# Patient Record
Sex: Female | Born: 1970 | Race: Black or African American | Hispanic: No | Marital: Married | Smoking: Never smoker
Health system: Southern US, Community
[De-identification: ages and names within clinical notes are randomized; demographics above are authoritative.]

## PROBLEM LIST (undated history)

## (undated) DIAGNOSIS — R51 Headache: Secondary | ICD-10-CM

## (undated) DIAGNOSIS — D649 Anemia, unspecified: Secondary | ICD-10-CM

## (undated) DIAGNOSIS — B20 Human immunodeficiency virus [HIV] disease: Secondary | ICD-10-CM

## (undated) DIAGNOSIS — Z21 Asymptomatic human immunodeficiency virus [HIV] infection status: Secondary | ICD-10-CM

## (undated) HISTORY — DX: Morbid (severe) obesity due to excess calories: E66.01

## (undated) HISTORY — PX: LAPAROSCOPY ABDOMEN DIAGNOSTIC: PRO50

## (undated) HISTORY — PX: ABDOMINAL HYSTERECTOMY: SHX81

## (undated) HISTORY — PX: CERVICAL CONIZATION W/BX: SHX1330

---

## 1999-04-14 ENCOUNTER — Ambulatory Visit (HOSPITAL_COMMUNITY): Admission: RE | Admit: 1999-04-14 | Discharge: 1999-04-14 | Payer: Self-pay | Admitting: Internal Medicine

## 1999-04-14 ENCOUNTER — Encounter: Admission: RE | Admit: 1999-04-14 | Discharge: 1999-04-14 | Payer: Self-pay | Admitting: Internal Medicine

## 1999-05-10 ENCOUNTER — Encounter: Admission: RE | Admit: 1999-05-10 | Discharge: 1999-05-10 | Payer: Self-pay | Admitting: Internal Medicine

## 1999-06-29 ENCOUNTER — Encounter: Admission: RE | Admit: 1999-06-29 | Discharge: 1999-06-29 | Payer: Self-pay | Admitting: Internal Medicine

## 1999-08-03 ENCOUNTER — Encounter: Admission: RE | Admit: 1999-08-03 | Discharge: 1999-08-03 | Payer: Self-pay | Admitting: Internal Medicine

## 1999-08-03 ENCOUNTER — Ambulatory Visit (HOSPITAL_COMMUNITY): Admission: RE | Admit: 1999-08-03 | Discharge: 1999-08-03 | Payer: Self-pay | Admitting: Internal Medicine

## 1999-08-25 ENCOUNTER — Encounter: Admission: RE | Admit: 1999-08-25 | Discharge: 1999-08-25 | Payer: Self-pay | Admitting: Internal Medicine

## 1999-11-08 ENCOUNTER — Ambulatory Visit (HOSPITAL_COMMUNITY): Admission: RE | Admit: 1999-11-08 | Discharge: 1999-11-08 | Payer: Self-pay | Admitting: Internal Medicine

## 1999-11-08 ENCOUNTER — Encounter: Admission: RE | Admit: 1999-11-08 | Discharge: 1999-11-08 | Payer: Self-pay | Admitting: Internal Medicine

## 2000-01-10 ENCOUNTER — Encounter: Admission: RE | Admit: 2000-01-10 | Discharge: 2000-01-10 | Payer: Self-pay | Admitting: Internal Medicine

## 2000-04-04 ENCOUNTER — Ambulatory Visit (HOSPITAL_COMMUNITY): Admission: RE | Admit: 2000-04-04 | Discharge: 2000-04-04 | Payer: Self-pay | Admitting: Internal Medicine

## 2000-04-04 ENCOUNTER — Encounter: Admission: RE | Admit: 2000-04-04 | Discharge: 2000-04-04 | Payer: Self-pay | Admitting: Internal Medicine

## 2000-04-26 ENCOUNTER — Encounter: Admission: RE | Admit: 2000-04-26 | Discharge: 2000-04-26 | Payer: Self-pay | Admitting: Internal Medicine

## 2000-09-05 ENCOUNTER — Ambulatory Visit (HOSPITAL_COMMUNITY): Admission: RE | Admit: 2000-09-05 | Discharge: 2000-09-05 | Payer: Self-pay | Admitting: Internal Medicine

## 2000-09-05 ENCOUNTER — Encounter: Admission: RE | Admit: 2000-09-05 | Discharge: 2000-09-05 | Payer: Self-pay | Admitting: Internal Medicine

## 2001-03-30 ENCOUNTER — Encounter: Admission: RE | Admit: 2001-03-30 | Discharge: 2001-03-30 | Payer: Self-pay | Admitting: Internal Medicine

## 2001-03-30 ENCOUNTER — Ambulatory Visit (HOSPITAL_COMMUNITY): Admission: RE | Admit: 2001-03-30 | Discharge: 2001-03-30 | Payer: Self-pay | Admitting: Internal Medicine

## 2003-07-08 ENCOUNTER — Other Ambulatory Visit: Admission: RE | Admit: 2003-07-08 | Discharge: 2003-07-08 | Payer: Self-pay | Admitting: Family Medicine

## 2006-10-22 ENCOUNTER — Emergency Department (HOSPITAL_COMMUNITY): Admission: EM | Admit: 2006-10-22 | Discharge: 2006-10-22 | Payer: Self-pay | Admitting: Emergency Medicine

## 2007-04-19 ENCOUNTER — Encounter: Admission: RE | Admit: 2007-04-19 | Discharge: 2007-04-19 | Payer: Self-pay | Admitting: *Deleted

## 2007-08-22 ENCOUNTER — Emergency Department (HOSPITAL_COMMUNITY): Admission: EM | Admit: 2007-08-22 | Discharge: 2007-08-22 | Payer: Self-pay | Admitting: Family Medicine

## 2008-01-22 ENCOUNTER — Ambulatory Visit: Payer: Self-pay | Admitting: Internal Medicine

## 2008-01-22 ENCOUNTER — Encounter: Admission: RE | Admit: 2008-01-22 | Discharge: 2008-01-22 | Payer: Self-pay | Admitting: Internal Medicine

## 2008-01-22 DIAGNOSIS — B20 Human immunodeficiency virus [HIV] disease: Secondary | ICD-10-CM | POA: Insufficient documentation

## 2008-01-22 LAB — CONVERTED CEMR LAB
ALT: 21 units/L (ref 0–35)
CO2: 25 meq/L (ref 19–32)
Chloride: 103 meq/L (ref 96–112)
Eosinophils Absolute: 0 10*3/uL (ref 0.0–0.7)
Eosinophils Relative: 0 % (ref 0–5)
HCT: 33.4 % — ABNORMAL LOW (ref 36.0–46.0)
HIV 1 RNA Quant: 31800 copies/mL — ABNORMAL HIGH (ref <50)
Hemoglobin: 11.2 g/dL — ABNORMAL LOW (ref 12.0–15.0)
MCV: 74.7 fL — ABNORMAL LOW (ref 78.0–100.0)
Monocytes Absolute: 0.4 10*3/uL (ref 0.1–1.0)
Neutro Abs: 1.3 10*3/uL — ABNORMAL LOW (ref 1.7–7.7)
Potassium: 3.8 meq/L (ref 3.5–5.3)
RBC: 4.47 M/uL (ref 3.87–5.11)
RDW: 13.5 % (ref 11.5–15.5)
Sodium: 138 meq/L (ref 135–145)
Total Bilirubin: 0.5 mg/dL (ref 0.3–1.2)
Total Protein: 9 g/dL — ABNORMAL HIGH (ref 6.0–8.3)

## 2008-02-05 ENCOUNTER — Ambulatory Visit: Payer: Self-pay | Admitting: Internal Medicine

## 2008-02-05 ENCOUNTER — Encounter (INDEPENDENT_AMBULATORY_CARE_PROVIDER_SITE_OTHER): Payer: Self-pay | Admitting: *Deleted

## 2008-02-05 ENCOUNTER — Other Ambulatory Visit: Admission: RE | Admit: 2008-02-05 | Discharge: 2008-02-05 | Payer: Self-pay | Admitting: Family Medicine

## 2008-02-05 DIAGNOSIS — B977 Papillomavirus as the cause of diseases classified elsewhere: Secondary | ICD-10-CM | POA: Insufficient documentation

## 2008-02-05 DIAGNOSIS — R87612 Low grade squamous intraepithelial lesion on cytologic smear of cervix (LGSIL): Secondary | ICD-10-CM | POA: Insufficient documentation

## 2008-02-05 DIAGNOSIS — F32A Depression, unspecified: Secondary | ICD-10-CM | POA: Insufficient documentation

## 2008-02-05 DIAGNOSIS — O00109 Unspecified tubal pregnancy without intrauterine pregnancy: Secondary | ICD-10-CM | POA: Insufficient documentation

## 2008-02-05 DIAGNOSIS — F329 Major depressive disorder, single episode, unspecified: Secondary | ICD-10-CM

## 2008-02-05 LAB — CONVERTED CEMR LAB: Pap Smear: ABNORMAL

## 2008-02-06 ENCOUNTER — Encounter: Payer: Self-pay | Admitting: Internal Medicine

## 2008-02-13 ENCOUNTER — Telehealth (INDEPENDENT_AMBULATORY_CARE_PROVIDER_SITE_OTHER): Payer: Self-pay | Admitting: *Deleted

## 2008-02-20 ENCOUNTER — Encounter (INDEPENDENT_AMBULATORY_CARE_PROVIDER_SITE_OTHER): Payer: Self-pay | Admitting: *Deleted

## 2008-03-20 ENCOUNTER — Encounter: Admission: RE | Admit: 2008-03-20 | Discharge: 2008-03-20 | Payer: Self-pay | Admitting: Internal Medicine

## 2008-03-20 ENCOUNTER — Ambulatory Visit: Payer: Self-pay | Admitting: Internal Medicine

## 2008-03-20 LAB — CONVERTED CEMR LAB
Albumin: 4 g/dL (ref 3.5–5.2)
BUN: 15 mg/dL (ref 6–23)
Basophils Relative: 1 % (ref 0–1)
Calcium: 9 mg/dL (ref 8.4–10.5)
Cholesterol: 206 mg/dL — ABNORMAL HIGH (ref 0–200)
Creatinine, Ser: 0.9 mg/dL (ref 0.40–1.20)
Eosinophils Absolute: 0.1 10*3/uL (ref 0.0–0.7)
Eosinophils Relative: 2 % (ref 0–5)
HCT: 31.6 % — ABNORMAL LOW (ref 36.0–46.0)
HIV 1 RNA Quant: 236 copies/mL — ABNORMAL HIGH (ref <50)
Hemoglobin: 11.1 g/dL — ABNORMAL LOW (ref 12.0–15.0)
LDL Cholesterol: 136 mg/dL — ABNORMAL HIGH (ref 0–99)
Lymphs Abs: 1.4 10*3/uL (ref 0.7–4.0)
MCHC: 35.1 g/dL (ref 30.0–36.0)
Monocytes Relative: 11 % (ref 3–12)
Neutrophils Relative %: 51 % (ref 43–77)
RBC: 4.29 M/uL (ref 3.87–5.11)

## 2008-06-11 ENCOUNTER — Ambulatory Visit (HOSPITAL_COMMUNITY): Admission: RE | Admit: 2008-06-11 | Discharge: 2008-06-11 | Payer: Self-pay | Admitting: Obstetrics and Gynecology

## 2008-06-11 ENCOUNTER — Encounter (INDEPENDENT_AMBULATORY_CARE_PROVIDER_SITE_OTHER): Payer: Self-pay | Admitting: Obstetrics and Gynecology

## 2008-06-30 ENCOUNTER — Ambulatory Visit: Payer: Self-pay | Admitting: Internal Medicine

## 2008-06-30 LAB — CONVERTED CEMR LAB
AST: 15 units/L (ref 0–37)
Albumin: 4 g/dL (ref 3.5–5.2)
Alkaline Phosphatase: 94 units/L (ref 39–117)
Basophils Absolute: 0 10*3/uL (ref 0.0–0.1)
Basophils Relative: 1 % (ref 0–1)
CO2: 22 meq/L (ref 19–32)
Calcium: 9 mg/dL (ref 8.4–10.5)
Chloride: 107 meq/L (ref 96–112)
Creatinine, Ser: 1.02 mg/dL (ref 0.40–1.20)
Eosinophils Absolute: 0.1 10*3/uL (ref 0.0–0.7)
Eosinophils Relative: 1 % (ref 0–5)
Glucose, Bld: 99 mg/dL (ref 70–99)
HCT: 33.4 % — ABNORMAL LOW (ref 36.0–46.0)
HIV 1 RNA Quant: 106 copies/mL — ABNORMAL HIGH (ref <48)
Neutro Abs: 4.3 10*3/uL (ref 1.7–7.7)
Neutrophils Relative %: 66 % (ref 43–77)
Potassium: 3.8 meq/L (ref 3.5–5.3)
RDW: 13.6 % (ref 11.5–15.5)
Total Bilirubin: 0.3 mg/dL (ref 0.3–1.2)
Total Protein: 8.1 g/dL (ref 6.0–8.3)

## 2009-01-01 ENCOUNTER — Ambulatory Visit: Payer: Self-pay | Admitting: Internal Medicine

## 2009-01-01 LAB — CONVERTED CEMR LAB
ALT: 22 units/L (ref 0–35)
AST: 20 units/L (ref 0–37)
Alkaline Phosphatase: 115 units/L (ref 39–117)
Basophils Absolute: 0 10*3/uL (ref 0.0–0.1)
Basophils Relative: 1 % (ref 0–1)
Bilirubin Urine: NEGATIVE
CO2: 24 meq/L (ref 19–32)
Creatinine, Ser: 0.85 mg/dL (ref 0.40–1.20)
GFR calc Af Amer: >60 mL/min (ref >60)
Glucose, Bld: 101 mg/dL — ABNORMAL HIGH (ref 70–99)
HIV-1 RNA Quant, Log: 1.98 — ABNORMAL HIGH (ref <1.68)
Hemoglobin, Urine: NEGATIVE
Hemoglobin: 11.7 g/dL — ABNORMAL LOW (ref 12.0–15.0)
MCV: 78.2 fL (ref 78.0–100.0)
Monocytes Relative: 11 % (ref 3–12)
Neutrophils Relative %: 63 % (ref 43–77)
Nitrite: NEGATIVE
Potassium: 4.1 meq/L (ref 3.5–5.3)
Specific Gravity, Urine: 1.026 (ref 1.005–1.030)
Urine Glucose: NEGATIVE mg/dL
Urobilinogen, UA: 0.2 (ref 0.0–1.0)
pH: 7 (ref 5.0–8.0)

## 2009-01-15 ENCOUNTER — Ambulatory Visit: Payer: Self-pay | Admitting: Internal Medicine

## 2009-01-16 ENCOUNTER — Encounter (INDEPENDENT_AMBULATORY_CARE_PROVIDER_SITE_OTHER): Payer: Self-pay | Admitting: Obstetrics and Gynecology

## 2009-01-16 ENCOUNTER — Other Ambulatory Visit: Admission: RE | Admit: 2009-01-16 | Discharge: 2009-01-16 | Payer: Self-pay | Admitting: Obstetrics and Gynecology

## 2009-01-19 ENCOUNTER — Encounter: Payer: Self-pay | Admitting: Internal Medicine

## 2009-02-02 ENCOUNTER — Telehealth: Payer: Self-pay | Admitting: Internal Medicine

## 2009-02-03 ENCOUNTER — Encounter: Payer: Self-pay | Admitting: Internal Medicine

## 2009-02-04 ENCOUNTER — Encounter (INDEPENDENT_AMBULATORY_CARE_PROVIDER_SITE_OTHER): Payer: Self-pay | Admitting: *Deleted

## 2009-02-11 ENCOUNTER — Encounter: Payer: Self-pay | Admitting: Internal Medicine

## 2009-02-12 ENCOUNTER — Encounter: Payer: Self-pay | Admitting: Internal Medicine

## 2009-02-12 ENCOUNTER — Encounter (INDEPENDENT_AMBULATORY_CARE_PROVIDER_SITE_OTHER): Payer: Self-pay | Admitting: *Deleted

## 2009-03-17 ENCOUNTER — Emergency Department (HOSPITAL_COMMUNITY): Admission: EM | Admit: 2009-03-17 | Discharge: 2009-03-17 | Payer: Self-pay | Admitting: Emergency Medicine

## 2009-06-09 ENCOUNTER — Encounter: Payer: Self-pay | Admitting: Internal Medicine

## 2009-06-11 ENCOUNTER — Telehealth: Payer: Self-pay | Admitting: Internal Medicine

## 2009-07-13 ENCOUNTER — Ambulatory Visit: Payer: Self-pay | Admitting: Internal Medicine

## 2009-07-13 LAB — CONVERTED CEMR LAB
AST: 16 units/L (ref 0–37)
Alkaline Phosphatase: 100 units/L (ref 39–117)
Basophils Absolute: 0 10*3/uL (ref 0.0–0.1)
Eosinophils Absolute: 0.1 10*3/uL (ref 0.0–0.7)
Glucose, Bld: 97 mg/dL (ref 70–99)
HCT: 35.9 % — ABNORMAL LOW (ref 36.0–46.0)
HDL: 61 mg/dL (ref >39)
HIV 1 RNA Quant: 93 copies/mL — ABNORMAL HIGH (ref <48)
Hemoglobin: 12.4 g/dL (ref 12.0–15.0)
LDL Cholesterol: 133 mg/dL — ABNORMAL HIGH (ref 0–99)
Lymphocytes Relative: 24 % (ref 12–46)
MCHC: 34.5 g/dL (ref 30.0–36.0)
MCV: 78.6 fL (ref >=78.0)
Monocytes Absolute: 0.4 10*3/uL (ref 0.1–1.0)
Monocytes Relative: 9 % (ref 3–12)
Neutrophils Relative %: 65 % (ref 43–77)
RBC: 4.57 M/uL (ref 3.87–5.11)
RDW: 13.8 % (ref 11.5–15.5)
Total Bilirubin: 0.3 mg/dL (ref 0.3–1.2)
Total CHOL/HDL Ratio: 3.3
Triglycerides: 40 mg/dL (ref <150)
VLDL: 8 mg/dL (ref 0–40)

## 2009-07-28 ENCOUNTER — Ambulatory Visit: Payer: Self-pay | Admitting: Internal Medicine

## 2009-08-19 ENCOUNTER — Other Ambulatory Visit: Admission: RE | Admit: 2009-08-19 | Discharge: 2009-08-19 | Payer: Self-pay | Admitting: Obstetrics and Gynecology

## 2009-09-21 ENCOUNTER — Encounter: Payer: Self-pay | Admitting: Internal Medicine

## 2009-11-23 ENCOUNTER — Telehealth (INDEPENDENT_AMBULATORY_CARE_PROVIDER_SITE_OTHER): Payer: Self-pay | Admitting: *Deleted

## 2009-11-25 ENCOUNTER — Encounter: Payer: Self-pay | Admitting: Internal Medicine

## 2009-11-25 ENCOUNTER — Ambulatory Visit: Payer: Self-pay | Admitting: Internal Medicine

## 2009-11-25 ENCOUNTER — Encounter (INDEPENDENT_AMBULATORY_CARE_PROVIDER_SITE_OTHER): Payer: Self-pay | Admitting: Licensed Clinical Social Worker

## 2009-11-25 LAB — CONVERTED CEMR LAB
CO2: 24 meq/L (ref 19–32)
Calcium: 9.2 mg/dL (ref 8.4–10.5)
Chloride: 105 meq/L (ref 96–112)
Potassium: 3.8 meq/L (ref 3.5–5.3)

## 2009-11-26 ENCOUNTER — Telehealth: Payer: Self-pay | Admitting: Internal Medicine

## 2009-12-31 ENCOUNTER — Telehealth: Payer: Self-pay | Admitting: Internal Medicine

## 2010-01-07 ENCOUNTER — Encounter: Payer: Self-pay | Admitting: Infectious Disease

## 2010-01-18 ENCOUNTER — Ambulatory Visit: Payer: Self-pay | Admitting: Internal Medicine

## 2010-01-18 LAB — CONVERTED CEMR LAB
ALT: 31 units/L (ref 0–35)
Alkaline Phosphatase: 112 units/L (ref 39–117)
BUN: 13 mg/dL (ref 6–23)
Calcium: 8.8 mg/dL (ref 8.4–10.5)
Creatinine, Ser: 0.81 mg/dL (ref 0.40–1.20)
HCT: 33.5 % — ABNORMAL LOW (ref 36.0–46.0)
HIV 1 RNA Quant: <48 copies/mL (ref <48)
HIV-1 RNA Quant, Log: <1.68 (ref <1.68)
LDL Cholesterol: 122 mg/dL — ABNORMAL HIGH (ref 0–99)
Lymphocytes Relative: 18 % (ref 12–46)
Monocytes Absolute: 0.4 10*3/uL (ref 0.1–1.0)
Neutro Abs: 4.5 10*3/uL (ref 1.7–7.7)
Potassium: 4.2 meq/L (ref 3.5–5.3)
RBC: 4.27 M/uL (ref 3.87–5.11)
Total Bilirubin: 0.4 mg/dL (ref 0.3–1.2)
Triglycerides: 64 mg/dL (ref <150)

## 2010-02-02 ENCOUNTER — Ambulatory Visit: Payer: Self-pay | Admitting: Internal Medicine

## 2010-02-02 DIAGNOSIS — J309 Allergic rhinitis, unspecified: Secondary | ICD-10-CM | POA: Insufficient documentation

## 2010-02-17 ENCOUNTER — Encounter (INDEPENDENT_AMBULATORY_CARE_PROVIDER_SITE_OTHER): Payer: Self-pay | Admitting: *Deleted

## 2010-02-17 ENCOUNTER — Encounter: Admission: RE | Admit: 2010-02-17 | Discharge: 2010-02-17 | Payer: Self-pay | Admitting: Family Medicine

## 2010-02-17 ENCOUNTER — Other Ambulatory Visit: Admission: RE | Admit: 2010-02-17 | Discharge: 2010-02-17 | Payer: Self-pay | Admitting: Obstetrics and Gynecology

## 2010-02-17 LAB — CONVERTED CEMR LAB: Pap Smear: NEGATIVE

## 2010-03-17 ENCOUNTER — Telehealth (INDEPENDENT_AMBULATORY_CARE_PROVIDER_SITE_OTHER): Payer: Self-pay | Admitting: *Deleted

## 2010-04-29 ENCOUNTER — Encounter (INDEPENDENT_AMBULATORY_CARE_PROVIDER_SITE_OTHER): Payer: Self-pay | Admitting: *Deleted

## 2010-06-08 ENCOUNTER — Ambulatory Visit: Payer: Self-pay | Admitting: Internal Medicine

## 2010-06-08 LAB — CONVERTED CEMR LAB
ALT: 21 units/L (ref 0–35)
Albumin: 4.4 g/dL (ref 3.5–5.2)
Alkaline Phosphatase: 79 units/L (ref 39–117)
Basophils Absolute: 0 10*3/uL (ref 0.0–0.1)
CO2: 22 meq/L (ref 19–32)
Calcium: 8.5 mg/dL (ref 8.4–10.5)
Creatinine, Ser: 0.83 mg/dL (ref 0.40–1.20)
Eosinophils Absolute: 0 10*3/uL (ref 0.0–0.7)
Eosinophils Relative: 1 % (ref 0–5)
Glucose, Bld: 95 mg/dL (ref 70–99)
HIV 1 RNA Quant: <20 copies/mL (ref <20)
HIV-1 RNA Quant, Log: <1.3 (ref <1.30)
LDL Cholesterol: 132 mg/dL — ABNORMAL HIGH (ref 0–99)
MCHC: 35.3 g/dL (ref 30.0–36.0)
MCV: 77.8 fL — ABNORMAL LOW (ref 78.0–100.0)
Neutro Abs: 4.3 10*3/uL (ref 1.7–7.7)
Neutrophils Relative %: 71 % (ref 43–77)
Sodium: 139 meq/L (ref 135–145)
Total Bilirubin: 0.3 mg/dL (ref 0.3–1.2)
Total Protein: 7.3 g/dL (ref 6.0–8.3)
Triglycerides: 94 mg/dL (ref <150)
WBC: 6.1 10*3/uL (ref 4.0–10.5)

## 2010-06-24 ENCOUNTER — Encounter: Payer: Self-pay | Admitting: Internal Medicine

## 2010-06-24 ENCOUNTER — Ambulatory Visit: Payer: Self-pay | Admitting: Internal Medicine

## 2010-06-24 DIAGNOSIS — F411 Generalized anxiety disorder: Secondary | ICD-10-CM | POA: Insufficient documentation

## 2010-06-25 ENCOUNTER — Encounter: Payer: Self-pay | Admitting: Internal Medicine

## 2010-07-06 ENCOUNTER — Encounter (INDEPENDENT_AMBULATORY_CARE_PROVIDER_SITE_OTHER): Payer: Self-pay | Admitting: *Deleted

## 2010-07-13 ENCOUNTER — Encounter: Payer: Self-pay | Admitting: Internal Medicine

## 2010-07-20 ENCOUNTER — Encounter: Payer: Self-pay | Admitting: Internal Medicine

## 2010-09-05 LAB — CONVERTED CEMR LAB
HCV Ab: NEGATIVE
Hepatitis B Surface Ag: NEGATIVE

## 2010-09-09 NOTE — Progress Notes (Signed)
Summary: c/o fatigue and poss. sinus inf., appt. 11/25/09 w/ Dr. 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 She also c/o headaches and congestion, thinks she may have a sinus infection. Wants to know if she needs to be seen? Initial call taken by: [name] CMA,  November 23, 2009 10:58 AM  Follow-up for Phone Call        Phone call to pt. Offered pt. acute appt. for 11/25/09 w/ Dr. Philipp Deputy for these problems.  Pt. to come @ 3:15 pm. [name] RN  November 23, 2009 11:56 AM

## 2010-09-09 NOTE — Miscellaneous (Signed)
Summary: RW Update  Clinical Lists Changes  Observations: Added new observation of RACE: African American (07/13/2010 14:02) Added new observation of PAYOR: Private (07/13/2010 14:02) Added new observation of GENDER: Female (07/13/2010 14:02)

## 2010-09-09 NOTE — Assessment & Plan Note (Signed)
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  Staying asleep is the problem, and Depression.  History of Present Illness: Alyssa Wilson is in for her routine visit.  She continues to be extremely stressed out about her work.  She says that she feels very anxious when she's at work and has trouble working a complete day.  She has been trying to take Friday afternoons off but still finds that she is extremely anxious when she knows she has to return to work.  She's had frequent episodes where she will awaken at night with a racing heart and panicked feeling.  She has also had increasing episodes where she will cry spontaneously and has been feeling depressed.  She has not had any thoughts of hurting herself.  Other than work she says that she is stressed because her 34 year old son is no longer living at home.  He is living with her grandmother but she says that "he is not doing everything he should." He was in counseling but has refused to go recently.  She started seeing a counselor two weeks ago.  She had a normal Pap smear recently. She says that her recent TB skin test was nonreactive. She denies missing a single dose of her Atripla.  Depression History:      The patient denies a depressed mood most of the day and a diminished interest in her usual daily activities.         Preventive Screening-Counseling & Management  Alcohol-Tobacco     Alcohol drinks/day: <1     Alcohol type: wine     Smoking Status: never     Passive Smoke Exposure: no  Caffeine-Diet-Exercise     Caffeine use/day: tea and sodas     Does Patient Exercise: yes     Type of exercise: walking     Exercise (avg: min/session): <30     Times/week: 7  Hep-HIV-STD-Contraception  HIV Risk: no risk noted     HIV Risk Counseling: not indicated-no HIV risk noted  Safety-Violence-Falls     Seat Belt Use: yes  Comments: declined condoms      Sexual History:  currently monogamous.        Drug Use:  never.     Updated Prior Medication List: ATRIPLA 600-200-300 MG TABS (EFAVIRENZ-EMTRICITAB-TENOFOVIR) Take 1 tablet by mouth once a day MULTIVITAMINS  TABS (MULTIPLE VITAMIN) Take 1 tablet by mouth once a day VITAMIN D (ERGOCALCIFEROL) 50000 UNIT CAPS (ERGOCALCIFEROL) Take one capsule per week for 12 weeks  Current Allergies (reviewed today): No known allergies  Social History: Sexual History:  currently monogamous  Vital Signs:  Patient profile:   40 year old female Menstrual status:  regular LMP:     06/08/2010 Height:      62.25 inches (158.12 cm) Weight:      163.0 pounds (74.09 kg) BMI:     29.68 Temp:     98.1 degrees F (36.72 degrees C) oral Pulse rate:   79 / minute BP sitting:   121 / 86  (left arm) Cuff size:   regular  Vitals Entered By: [name] RN (June 24, 2010 10:01 AM) CC: follow-up visit, upper left chest pain x 1 month, feels her heart beating,  she may even shake, especially at nigh and at her job as a Social research officer, 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  Staying asleep is the problem, Depression Is Patient Diabetic? No Pain Assessment Patient in pain? yes     Location: upper left chest, near shoulder Intensity: 3 Type: little stabs Onset of pain  things it might be heart palpitations, intermittent Nutritional Status BMI of 25 - 29 = overweight Nutritional Status Detail appetite "good"  Have you ever been in a relationship where you felt threatened, hurt or afraid?No   Does patient need assistance? Functional Status Self care Ambulation Normal Comments no missed doses or rxes LMP (date): 06/08/2010 LMP - Character: normal     Menstrual Status regular Enter  LMP: 06/08/2010 Last PAP Result normal        Medication Adherence: 06/24/2010   Adherence to medications reviewed with patient. Counseling to provide adequate adherence provided                                Physical Exam  General:  alert and well-nourished.   Mouth:  pharynx pink and moist.  no thrush good dentition.   Lungs:  normal breath sounds.  no crackles and no wheezes.   Heart:  normal rate, regular rhythm, and no murmur.   Skin:  no rashes.   Axillary Nodes:  no R axillary adenopathy and no L axillary adenopathy.   Psych:  normally interactive, good eye contact, not depressed appearing, tearful, and slightly anxious.     Impression & Recommendations:  Problem # 1:  HIV INFECTION (ICD-042) Her HIV infection is under excellent control.  I will continue her current regimen. Diagnostics Reviewed:  HIV: CDC-defined AIDS (02/05/2008)   CD4: 420 (06/09/2010)   WBC: 6.1 (06/08/2010)   Hgb: 12.4 (06/08/2010)   HCT: 35.1 (06/08/2010)   Platelets: 159 (06/08/2010) HIV-1 RNA: <20 copies/mL (06/08/2010)   HBSAg: NEG (02/05/2008)  Orders: Est. Patient Level IV (99214)Future Orders: T-CD4SP ([facility]) (CD4SP) ... 12/21/2010 T-HIV Viral Load [phone] [phone]) ... 12/21/2010 T-Comprehensive Metabolic Panel [id number]) ... 12/21/2010 T-CBC w/Diff ([id number]) ... 12/21/2010 T-RPR (Syphilis) [id number]) ... 12/21/2010 T-Lipid Profile [id number]) ... 12/21/2010  Problem # 2:  ANXIETY (ICD-300.00) She is struggling with worsening anxiety and now seems mildly depressed.  I have encouraged her to continue seeing her counselor on a regular basis and have asked her to call me if she is feeling worse between now and her next visit. Orders: Est. Patient Level IV (87564)  Medications Added to Medication List This Visit: 1)  Vitamin D (ergocalciferol) 50000 Unit Caps (Ergocalciferol) .... Take one capsule per week for 12 weeks  Other Orders: Hepatitis B Vaccine >95yrs  [id number]) Admin 1st Vaccine (18841) Admin 1st Vaccine [facility]) [phone]) Influenza Vaccine NON MCR (16010)  Patient Instructions: 1)  Please schedule a follow-up appointment in 6 months.   Process Orders Check Orders Results:     [facility]: ABN not required for this insurance Tests Sent for requisitioning (June 24, 2010 10:44 AM):     12/21/2010: [facility] -- T-HIV Viral Load [phone] (signed)     12/21/2010: [facility] -- T-Comprehensive Metabolic Panel [[id number]] (signed)     12/21/2010: [facility] -- T-CBC w/Diff [[id number]] (signed)     12/21/2010: [facility] -- T-RPR (Syphilis) [id number] (signed)     12/21/2010: [facility] -- T-Lipid Profile [id number] (signed)     Hepatitis B Vaccine # 3    Vaccine Type: HepB Adult    Site: right deltoid    Mfr: Merck    Dose: 0.5 ml    Route: IM    Given by: [name] RN    Exp. Date: 07/26/2012    Lot #: [id number]  Influenza Vaccine    Vaccine Type: Fluvax Non-MCR    Site: left deltoid    Mfr: novartis    Dose: 0.5 ml    Route: IM    Given by: [name] RN    Exp. Date: 11/07/2010    Lot #: [id number]  Flu Vaccine Consent Questions    Do you have a history of severe allergic reactions to this vaccine? no    Any prior history of allergic reactions to egg and/or gelatin? no    Do you have a sensitivity to the preservative Thimersol? no    Do you have a past history of Guillan-Barre Syndrome? no    Do you currently have an acute febrile illness? no    Have you ever had a severe reaction to latex? no    Vaccine information given and explained to patient? yes    Are you currently pregnant? no

## 2010-09-09 NOTE — Assessment & Plan Note (Signed)
Summary: extreme fatigue, HA, sinus cong /dee   CC:  extreme fatigue, HA, and sinus congestion.  History of Present Illness: Pt's sister died unexpectedly 4/2 and she has been under a lot of stress makeing funeral arrangements etc. Then last week she felt like her body just shut down.  She feels very tired and has no energy.  She also c/o sinus congestion,fever and a non-productive cough.  Drainaged from her nose is yellow. She also c/o milde HA.  Preventive Screening-Counseling & Management  Alcohol-Tobacco     Alcohol drinks/day: <1     Alcohol type: wine     Smoking Status: never     Passive Smoke Exposure: no  Caffeine-Diet-Exercise     Caffeine use/day: tea and sodas     Does Patient Exercise: yes     Type of exercise: walking     Exercise (avg: min/session): <30     Times/week: 7  Safety-Violence-Falls     Seat Belt Use: yes   Updated Prior Medication List: ATRIPLA 600-200-300 MG TABS (EFAVIRENZ-EMTRICITAB-TENOFOVIR) Take 1 tablet by mouth once a day AMOXICILLIN 500 MG CAPS (AMOXICILLIN) Take 1 capsule by mouth three times a day NASACORT AQ 55 MCG/ACT AERS (TRIAMCINOLONE ACETONIDE(NASAL)) one spray two times a day  Current Allergies (reviewed today): No known allergies  Past History:  Past Medical History: Last updated: 02/05/2008 Depression  Review of Systems  The patient denies anorexia, dyspnea on exertion, and hemoptysis.    Vital Signs:  Patient profile:   40 year old female Menstrual status:  regular Height:      62.25 inches (158.12 cm) Weight:      159.0 pounds (72.27 kg) BMI:     28.95 Temp:     98.3 degrees F (36.83 degrees C) oral Pulse rate:   111 / minute BP sitting:   134 / 91  (left arm)  Vitals Entered By: Baxter Hire) (November 25, 2009 3:20 PM) CC: extreme fatigue,HA, sinus congestion Pain Assessment Patient in pain? no      Nutritional Status BMI of 25 - 29 = overweight Nutritional Status Detail appetite is okay per  patient  Have you ever been in a relationship where you felt threatened, hurt or afraid?No   Does patient need assistance? Functional Status Self care Ambulation Normal   Physical Exam  General:  alert, well-developed, well-nourished, and well-hydrated.   Head:  normocephalic and atraumatic.   Mouth:  pharynx pink and moist.  no thrush  Lungs:  normal breath sounds.     Impression & Recommendations:  Problem # 1:  ACUTE SINUSITIS, UNSPECIFIED (ICD-461.9) Will treat for sinusitis. Fatigue most likely due to stress and the sinus infection.  If it persists she is to call. Her updated medication list for this problem includes:    Amoxicillin 500 Mg Caps (Amoxicillin) .Marland Kitchen... Take 1 capsule by mouth three times a day    Nasacort Aq 55 Mcg/act Aers (Triamcinolone acetonide(nasal)) ..... One spray two times a day  Orders: Est. Patient Level III ([address])  Medications Added to Medication List This Visit: 1)  Amoxicillin 500 Mg Caps (Amoxicillin) .... Take 1 capsule by mouth three times a day 2)  Nasacort Aq 55 Mcg/act Aers (Triamcinolone acetonide(nasal)) .... One spray two times a day  Other Orders: T-Basic Metabolic Panel [id number]) Prescriptions: AMOXICILLIN 500 MG CAPS (AMOXICILLIN) Take 1 capsule by mouth three times a day  #21 x 0   Entered and Authorized by:   Yisroel Ramming MD   Signed by:  Yisroel Ramming MD on 11/25/2009   Method used:   Print then Give to Patient   RxID:   [id number] NASACORT AQ 55 MCG/ACT AERS (TRIAMCINOLONE ACETONIDE(NASAL)) one spray two times a day  #1 x 0   Entered and Authorized by:   Yisroel Ramming MD   Signed by:   Yisroel Ramming MD on 11/25/2009   Method used:   Print then Give to Patient   RxID:   [id number]  Process Orders Check Orders Results:     [facility]: ABN not required for this insurance Tests Sent for requisitioning (November 25, 2009 3:33 PM):     11/25/2009: [facility] -- T-Basic  Metabolic Panel [phone] (signed)    Appended Document: extreme fatigue, HA, sinus cong /dee Patient was givien work note, 2 others were printed but destroyed because of errors. [name] CMA  November 25, 2009 3:43 PM

## 2010-09-09 NOTE — Miscellaneous (Signed)
Summary: RW update  Clinical Lists Changes  Observations: Added new observation of RWPARTICIP: Yes (04/29/2010 9:07)

## 2010-09-09 NOTE — Miscellaneous (Signed)
Summary: Dr. Laurel Dimmer  Dr. Laurel Dimmer   Imported By: Florinda Marker 06/29/2010 11:56:26  _____________________________________________________________________  External Attachment:    Type:   Image     Comment:   External Document

## 2010-09-09 NOTE — Medication Information (Signed)
[facility]: [facility]   Imported By: Florinda Marker 09/25/2009 15:08:43  _____________________________________________________________________  External Attachment:    Type:   Image     Comment:   External Document

## 2010-09-09 NOTE — Letter (Signed)
Summary: Out of Work  [facility]  [address]   [place], [place] [address]   Phone: [phone]  Fax: [phone]    November 25, 2009   Employee:  CHALYN AMESCUA    To Whom It May Concern:   For Medical reasons, please excuse the above named employee from work for the following dates:  Start:    End:    If you need additional information, please feel free to contact our office.         Sincerely,    Dr. Yisroel Ramming

## 2010-09-09 NOTE — Assessment & Plan Note (Signed)
Summary: F/U/VS   CC:  follow-up visit, sore throat x 3 days, and pt has gotten married in mAY 2011.  History of Present Illness: Alyssa Wilson is in for her routine visit.  Her sister died suddenly in early 11/20/22.  They were never quite sure what caused her death.  [name] has been under a great deal of stress and has felt slightly depressed since that time.  She is found her work at [employer] to be extremely stressful and this has forced her to take some time off with FMLA.  She is looking for a new job.  She is planning to call her employee assistance program and seek counseling.  On happier note, she and her fianc got married recently and she is very happy with that.  Her fianc is well aware of her HIV infection.  He is negative and they always use condoms.  She does not recall missing a single dose of her Atripla.  Preventive Screening-Counseling & Management  Alcohol-Tobacco     Alcohol drinks/day: <1     Alcohol type: wine     Smoking Status: never     Passive Smoke Exposure: no  Caffeine-Diet-Exercise     Caffeine use/day: tea and sodas     Does Patient Exercise: yes     Type of exercise: walking     Exercise (avg: min/session): <30     Times/week: 7  Hep-HIV-STD-Contraception     HIV Risk: no risk noted  Safety-Violence-Falls     Seat Belt Use: yes  Comments: declined condoms   Updated Prior Medication List: ATRIPLA 600-200-300 MG TABS (EFAVIRENZ-EMTRICITAB-TENOFOVIR) Take 1 tablet by mouth once a day  Current Allergies (reviewed today): No known allergies  Vital Signs:  Patient profile:   40 year old female Menstrual status:  regular Height:      62.25 inches (158.12 cm) Weight:      160.5 pounds (72.95 kg) BMI:     29.23 Temp:     98.3 degrees F (36.83 degrees C) oral Pulse rate:   107 / minute BP sitting:   122 / 75  (left arm) Cuff size:   regular  Vitals Entered By: [name] RN (February 02, 2010 9:41 AM) CC: follow-up visit, sore  throat x 3 days, pt has gotten married in mAY 2011 Is Patient Diabetic? No Pain Assessment Patient in pain? no      Nutritional Status BMI of 25 - 29 = overweight Nutritional Status Detail appettie "good"  Have you ever been in a relationship where you felt threatened, hurt or afraid?No   Does patient need assistance? Functional Status Self care Ambulation Normal Comments no missed doses   Physical Exam  General:  alert and overweight-appearing.   Mouth:  pharynx pink and moist.  no thrush  Lungs:  normal breath sounds.   Heart:  normal rate, regular rhythm, and no murmur.   Psych:  normally interactive, good eye contact, not anxious appearing, and not depressed appearing.          Medication Adherence: 02/02/2010   Adherence to medications reviewed with patient. Counseling to provide adequate adherence provided   Prevention For Positives: 02/02/2010   Safe sex practices discussed with patient. Condoms offered.                             Impression & Recommendations:  Problem # 1:  HIV INFECTION (ICD-042) [name]'s HIV infection remains under  excellent control.  I will not make any changes today. Diagnostics Reviewed:  HIV: CDC-defined AIDS (02/05/2008)   CD4: 320 (01/19/2010)   WBC: 6.1 (01/18/2010)   Hgb: 11.8 (01/18/2010)   HCT: 33.5 (01/18/2010)   Platelets: 152 (01/18/2010) HIV-1 RNA: <48 copies/mL (01/18/2010)   HBSAg: NEG (02/05/2008)  Orders: Est. Patient Level IV (99214)Future Orders: T-CD4SP ([facility]) (CD4SP) ... 08/01/2010 T-HIV Viral Load [id number]) ... 08/01/2010 T-Comprehensive Metabolic Panel [id number]) ... 08/01/2010 T-CBC w/Diff ([id number]) ... 08/01/2010 T-RPR (Syphilis) [id number]) ... 08/01/2010 T-Lipid Profile [id number]) ... 08/01/2010  Problem # 2:  DEPRESSION (ICD-311) I have encouraged her to seek counseling with her employee assistance program.  I also asked her to call me if she does not feel that that is helping with  her grief reaction and mild depression. Orders: Est. Patient Level IV (02725)  Problem # 3:  ALLERGIC RHINITIS (ICD-477.9) I told her that it would be reasonable for her to try any of the over-the-counter antihistamines if it seems to help with her allergies. The following medications were removed from the medication list:    Nasacort Aq 55 Mcg/act Aers (Triamcinolone acetonide(nasal)) ..... One spray two times a day    Flonase 50 Mcg/act Susp (Fluticasone propionate) ..... One spray each nostril daily  Orders: Est. Patient Level IV (36644)  Medications Added to Medication List This Visit: 1)  Multivitamins Tabs (Multiple vitamin) .... Take 1 tablet by mouth once a day  Patient Instructions: 1)  Please schedule a follow-up appointment in 6 months.    Hepatitis B Vaccine # 2 (to be given today)        Medication Adherence: 02/02/2010   Adherence to medications reviewed with patient. Counseling to provide adequate adherence provided    Prevention For Positives: 02/02/2010   Safe sex practices discussed with patient. Condoms offered.                            Appended Document: F/U - #2 Hep B vaccine   Hepatitis B Vaccine # 2    Vaccine Type: HepB Adult    Site: left deltoid    Mfr: Merck    Dose: 0.5 ml    Route: IM    Given by: [name] RN    Exp. Date: 12/06/2011    Lot #: [id number]

## 2010-09-09 NOTE — Progress Notes (Signed)
Summary: refill/mld  Phone Note Call from Patient   Caller: Patient Reason for Call: Refill Medication Summary of Call: Patient called stating that she tried to call in her refill and was told that she does not have any refills available.  The pharmacy advised her to contact her doctor office to have a new prescritpion phoned in.  I asked patient which pharmacy she uses and the phone number and we will take care of that for her.  It is [facility].  The number is [phone] Initial call taken by: Paulo Fruit  BS,CPht II,MPH,  March 17, 2010 2:49 PM    Prescriptions: ATRIPLA 600-200-300 MG TABS (EFAVIRENZ-EMTRICITAB-TENOFOVIR) Take 1 tablet by mouth once a day  #30 x 12   Entered by:   Paulo Fruit  BS,CPht II,MPH   Authorized by:   Cliffton Asters MD   Signed by:   Paulo Fruit  BS,CPht II,MPH on 03/17/2010   Method used:   Telephoned to ...         RxID:   [id number]  spoke to Robin at [facility] who read back the order.  They have patient as Jensyn Shave in their system. Paulo Fruit  BS,CPht II,MPH  March 17, 2010 2:59 PM

## 2010-09-09 NOTE — Progress Notes (Signed)
Summary: FMLA paperwork for completion by Dr. Orvan Falconer  Phone Note Call from Patient Call back at Home Phone [phone]   Caller: Patient Call For: Cliffton Asters MD Reason for Call: Talk to Doctor Summary of Call: Requesting FMLA paperwork to be completed as soon as possible.  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  Fax # on the forms.  RN to fax forms to Dr. Orvan Falconer at his office for completion. [name] RN  Dec 31, 2009 1:22 PM   Follow-up for Phone Call        Please find out the reason for FMLA request so I can fill it out on my return. Follow-up by: Cliffton Asters MD,  Jan 01, 2010 11:48 AM  Additional Follow-up for Phone Call Additional follow up Details #1::        Patient stated that its for intermittant days just in case she is out sick, or to be able to get off for her doctor's appt. Additional Follow-up by: [name] CMA,  Jan 01, 2010 3:09 PM    Additional Follow-up for Phone Call Additional follow up Details #2::    Pt informed form not completed and Dr Orvan Falconer is on vacation.   Pt says she absolutely has to have form completed tomorrow.  Her job is in jeopardy if she does not turn this form into her employer.  She stated it has been here two weeks.   I will check to see if Dr Algis Liming will sign form.  Pt would like a copy and to have it faxed to her employer also.  [phone] pt phone. Tomasita Morrow RN  January 07, 2010 10:59 AM   Additional Follow-up for Phone Call Additional follow up Details #3:: Details for Additional Follow-up Action Taken: Form completed by Dr Algis Liming   Faxed t [employer] [phone]  Copy sent for scanning and also placed up front for patient pick up.  Tomasita Morrow RN  January 07, 2010 12:18 PM

## 2010-09-09 NOTE — Progress Notes (Signed)
Summary: Problems with nasal spray script  Phone Note Call from Patient   Summary of Call: Pt was given the brand name drug for the nasal spray and says the pharmacy will not change to generic.  She can not afford brand. Can we call to change? Tomasita Morrow RN  November 26, 2009 2:12 PM  can you change med to a generic nasal  medication? Pt also wonders if she would benefit from restarting the once weekly azithromycin. Initial call taken by: Tomasita Morrow RN,  November 26, 2009 2:12 PM  Follow-up for Phone Call        yes - what will her insurance cover No on azithro - CD4ct 270 Follow-up by: Yisroel Ramming MD,  November 26, 2009 2:27 PM  Additional Follow-up for Phone Call Additional follow up Details #1::        flonase.    Additional Follow-up for Phone Call Additional follow up Details #2::    ok  New/Updated Medications: FLONASE 50 MCG/ACT SUSP (FLUTICASONE PROPIONATE) one spray each nostril daily Prescriptions: FLONASE 50 MCG/ACT SUSP (FLUTICASONE PROPIONATE) one spray each nostril daily  #1 x 0   Entered by:   Tomasita Morrow RN   Authorized by:   Yisroel Ramming MD   Signed by:   Tomasita Morrow RN on 11/26/2009   Method used:   Electronically to        [facility]  [address] [id number] (retail[facility]       [address]       [place], [place]  [address]       Ph: [phone]       Fax: [phone]   RxID:   [id number]

## 2010-09-09 NOTE — Letter (Signed)
Summary: [employer]: FMLA  [employer]: FMLA   Imported By: Florinda Marker 01/08/2010 12:06:32  _____________________________________________________________________  External Attachment:    Type:   Image     Comment:   External Document

## 2010-09-09 NOTE — Letter (Signed)
Summary: Out of Work  [facility]  [address]   [place], [place] [address]   Phone: [phone]  Fax: [phone]    November 25, 2009   Employee:  Destani L KING    To Whom It May Concern:   For Medical reasons, please excuse the above named employee from work for the following dates:  Start:    End:    If you need additional information, please feel free to contact our office.         Sincerely,    Rihanna Marseille CMA 

## 2010-09-09 NOTE — Miscellaneous (Signed)
Summary: 02/17/2010 PAP - NEGATIVE  Clinical Lists Changes  Observations: Added new observation of PAP SMEAR: NEGATIVE (02/17/2010 18:40) Added new observation of LAST PAP DAT: 02/17/2010 (02/17/2010 18:40)

## 2010-09-09 NOTE — Letter (Signed)
Summary: Out of Work  [facility]  [address]   [place], [place] [address]   Phone: [phone]  Fax: [phone]    November 25, 2009   Employee:  NANCYANN COTTERMAN    To Whom It May Concern:   For Medical reasons, please excuse the above named employee from work for the following dates:  Start:    End:    If you need additional information, please feel free to contact our office.         Sincerely,    [name] CMA

## 2010-09-10 ENCOUNTER — Encounter (INDEPENDENT_AMBULATORY_CARE_PROVIDER_SITE_OTHER): Payer: Self-pay | Admitting: *Deleted

## 2010-09-15 NOTE — Miscellaneous (Signed)
  Clinical Lists Changes  Observations: Added new observation of LATINO/HISP: No (09/10/2010 16:04)

## 2010-10-25 LAB — T-HELPER CELL (CD4) - (RCID CLINIC ONLY)
CD4 % Helper T Cell: 29 % — ABNORMAL LOW (ref 33–55)
CD4 T Cell Abs: 320 uL — ABNORMAL LOW (ref 400–2700)

## 2010-11-09 LAB — T-HELPER CELL (CD4) - (RCID CLINIC ONLY): CD4 % Helper T Cell: 27 % — ABNORMAL LOW (ref 33–55)

## 2010-11-11 ENCOUNTER — Other Ambulatory Visit: Payer: Self-pay | Admitting: Obstetrics and Gynecology

## 2010-12-21 NOTE — H&P (Signed)
NAME:  Alyssa Wilson, Alyssa Wilson NO.:  [id number]   MEDICAL RECORD NO.:  [id number]           PATIENT TYPE:   LOCATION:                                 FACILITY:   PHYSICIAN:  Gerald Leitz, MD          DATE OF BIRTH:  1970/12/09   DATE OF ADMISSION:  DATE OF DISCHARGE:                              HISTORY & PHYSICAL   The patient is scheduled for surgery on June 11, 2008.   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   PAST MEDICAL HISTORY:  Significant for HIV.  A viral load on March 20, 2008 was 236.  CD4 count was 230.   OTHER PAST MEDICAL HISTORY:  Migraine headache, sickle cell trait with  HIV.   INFECTIOUS DISEASE PHYSICIAN:  Cliffton Asters, MD   PAST OB HISTORY:  Spontaneous vaginal delivery x2, ectopic pregnancy x1.   GYNECOLOGIC HISTORY:  Contraception abstinence.  Gonorrhea, treated in  1993.  HIV, currently Trichomonas greater than 3 years ago.   X-ray on February 05, 2008, low-grade squamous intraepithelial lesion.  Colposcopy on March 25, 2008, CIN 2.  LEEP procedure on April 21, 2008, CIN-2 and CIN-3 with involvement of exocervical margins of  resection.   PAST SURGICAL HISTORY:  Laparoscopy for ectopic pregnancy in 1998.   MEDICATIONS:  Septra and Atripla.   ALLERGIES:  No known drug allergies.   SOCIAL HISTORY:  The patient is single, Occupational psychologist  for [employer].  She denies tobacco use or alcohol use.  No illicit  drug use.   FAMILY HISTORY:  Negative for breast, ovarian, or colon cancer.   REVIEW OF SYSTEMS:  Negative except as stated in the history of present  illness.   PHYSICAL EXAMINATION:  VITAL SIGNS:  Blood pressure 110/72 and weight  150-1/2 pounds.  CARDIOVASCULAR:  Regular rate  and rhythm.  LUNGS:  Clear to auscultation bilaterally.  ABDOMEN:  Soft, nontender, and nondistended.  No masses.  PELVIC:  No vulvar or vaginal lesions noted.  The cervix appears to be  healing well status post LEEP.  No evidence of cervical stenosis.  On  bimanual exam, normal-sized uterus.  No adnexal masses or tenderness.  EXTREMITIES:  No clubbing, cyanosis, or edema.   ASSESSMENT AND PLAN:  A 40 year old with high-grade cervical dysplasia  involving the exocervical and endocervical glands.  Recommend cold knife  conization for further evaluation and treatment.  Risks, benefits, and  alternatives of surgery were discussed with the patient including, but  not limited to, infection bleeding, damage to the surrounding organs,  operative risk of cervical stenosis as well as cervical incompetence.  The patient voiced understanding of all risks and desired to proceed  with cold knife conization.      Gerald Leitz, MD  Electronically Signed     TC/MEDQ  D:  06/09/2008  T:  06/10/2008  Job:  [id number]

## 2010-12-21 NOTE — Op Note (Signed)
NAME:  Alyssa Wilson, Alyssa Wilson NO.:  [id number]   MEDICAL RECORD NO.:  [id number]          PATIENT TYPE:  AMB   LOCATION:  [facility]                           FACILITY:  [facility]   PHYSICIAN:  Gerald Leitz, MD          DATE OF BIRTH:  1971/01/13   DATE OF PROCEDURE:  06/11/2008  DATE OF DISCHARGE:                               OPERATIVE REPORT   PREOPERATIVE DIAGNOSES:  1. High-grade cervical dysplasia.  2. Human immunodeficiency virus.   POSTOPERATIVE DIAGNOSES:  1. High-grade cervical dysplasia.  2. Human immunodeficiency virus.   PROCEDURE:  Cold-knife conization.   SURGEON:  Gerald Leitz, MD   ASSISTANT:  None.   ANESTHESIA:  General.   FINDINGS:  Hypopigmented area at the 12 o'clock position of the cervix  when Lugol's was applied.   SPECIMEN:  Cervical cone.   DISPOSITION:  Specimen to Pathology.   ESTIMATED BLOOD LOSS:  100 mL.   URINE OUTPUT:  Approximately 100 mL via in-and-out catheter prior to  procedure.   COMPLICATIONS:  None.   INDICATION:  This is a 40 year old who underwent LEEP procedure due to  biopsy confirmed high-grade dysplasia.  LEEP specimen showed high-grade  dysplasia with positive margins.  She was consented for cold-knife  conization.   DESCRIPTION OF PROCEDURE:  The patient was taken to the operating room  where she was placed under general anesthesia.  She was placed in the  dorsal lithotomy position, prepped, and draped in the usual sterile  fashion.  A weighted speculum was placed in the vaginal vault.  A Deaver  was used for retraction.  The cervix was examined and Lugol's was  applied.  The patient was noted to have hypopigmented lesions at the 12  o'clock position.  A 0 Vicryl was placed on the lateral aspect of the  cervix at the 3 o'clock position as well as at the 9 o'clock position  and used for retraction.  The scalpel was used to excise the cervical  cone.  The specimen was tagged at the 12 o'clock position.  Hemostasis  was  obtained with the electrocautery ball as well as Monsel.  Excellent  hemostasis was then  noted and Citrucel was placed into the incision site.  Excellent  hemostasis was noted and all instruments were removed from the patient's  vagina.  She was taken to the recovery room, awake, and in stable  condition.  Sponge, lap, and needle counts were correct x2.      Gerald Leitz, MD  Electronically Signed     TC/MEDQ  D:  06/11/2008  T:  06/12/2008  Job:  [id number]

## 2010-12-29 ENCOUNTER — Other Ambulatory Visit (INDEPENDENT_AMBULATORY_CARE_PROVIDER_SITE_OTHER): Payer: 59

## 2010-12-29 DIAGNOSIS — Z113 Encounter for screening for infections with a predominantly sexual mode of transmission: Secondary | ICD-10-CM

## 2010-12-29 DIAGNOSIS — B20 Human immunodeficiency virus [HIV] disease: Secondary | ICD-10-CM

## 2010-12-29 DIAGNOSIS — Z79899 Other long term (current) drug therapy: Secondary | ICD-10-CM

## 2010-12-30 LAB — CBC WITH DIFFERENTIAL/PLATELET
Eosinophils Relative: 1 % (ref 0–5)
Hemoglobin: 12.3 g/dL (ref 12.0–15.0)
Lymphs Abs: 1.3 10*3/uL (ref 0.7–4.0)
MCV: 76.9 fL — ABNORMAL LOW (ref 78.0–100.0)

## 2010-12-30 LAB — COMPLETE METABOLIC PANEL WITH GFR
ALT: 20 U/L (ref 0–35)
AST: 17 U/L (ref 0–37)
Albumin: 4.1 g/dL (ref 3.5–5.2)
BUN: 12 mg/dL (ref 6–23)
CO2: 22 mEq/L (ref 19–32)
Calcium: 9.2 mg/dL (ref 8.4–10.5)
Creat: 0.86 mg/dL (ref 0.40–1.20)
GFR, Est African American: >60 mL/min (ref >60)
GFR, Est Non African American: >60 mL/min (ref >60)
Glucose, Bld: 98 mg/dL (ref 70–99)
Potassium: 4.4 mEq/L (ref 3.5–5.3)
Total Protein: 7.5 g/dL (ref 6.0–8.3)

## 2010-12-30 LAB — T-HELPER CELL (CD4) - (RCID CLINIC ONLY)
CD4 % Helper T Cell: 35 % (ref 33–55)
CD4 T Cell Abs: 490 uL (ref 400–2700)

## 2010-12-30 LAB — LIPID PANEL: VLDL: 12 mg/dL (ref 0–40)

## 2010-12-30 LAB — RPR

## 2010-12-30 LAB — HIV-1 RNA QUANT-NO REFLEX-BLD: HIV-1 RNA Quant, Log: <1.3 {Log} (ref <1.30)

## 2011-01-12 ENCOUNTER — Encounter: Payer: Self-pay | Admitting: Internal Medicine

## 2011-01-12 ENCOUNTER — Other Ambulatory Visit: Payer: Self-pay | Admitting: *Deleted

## 2011-01-12 ENCOUNTER — Ambulatory Visit (INDEPENDENT_AMBULATORY_CARE_PROVIDER_SITE_OTHER): Payer: 59 | Admitting: Internal Medicine

## 2011-01-12 VITALS — BP 115/77 | HR 82 | Temp 98.0°F | Ht 63.0 in | Wt 167.8 lb

## 2011-01-12 DIAGNOSIS — B20 Human immunodeficiency virus [HIV] disease: Secondary | ICD-10-CM

## 2011-01-12 DIAGNOSIS — F3289 Other specified depressive episodes: Secondary | ICD-10-CM

## 2011-01-12 DIAGNOSIS — F329 Major depressive disorder, single episode, unspecified: Secondary | ICD-10-CM

## 2011-01-12 MED ORDER — FLUOXETINE HCL 20 MG PO CAPS: 20.0000 mg | ORAL_CAPSULE | Freq: Every day | ORAL | Status: DC

## 2011-01-12 NOTE — Assessment & Plan Note (Signed)
I believe he is depressed. I will start fluoxetine 20 mg daily and refer her to see Denyce Robert, our mental health counselor. I will see her back in 6 weeks.

## 2011-01-12 NOTE — Progress Notes (Signed)
  Subjective:    Patient ID: Alyssa Wilson, female    DOB: [birth year], 40 y.o.   MRN: [id number]  HPI Alyssa Wilson is in for her routine visit. 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 She has been feeling very stressed and fatigued and says that she does believe she is becoming more depressed. She was able to see a counselor through work and had 5 frequent visits. She could not afford to continue them after those 5 visits. The counselor who is a psychologist wanted her to see a psychiatrist but she could not afford the initial visit. She says that the psychologist wanted her to start on an antidepressant but this was never done because she did not see the psychiatrist. She states that she is not sleeping well and she has been eating more and gaining weight. She has not had any thoughts of hurting herself or others. She is missed a great deal of work and has been out on [employer]. She is afraid to quit work because she would lose her insurance for herself and her husband.    Review of Systems     Objective:   Physical Exam  Constitutional: She appears well-developed and well-nourished. No distress.  HENT:  Mouth/Throat: Oropharynx is clear and moist. No oropharyngeal exudate.  Cardiovascular: Regular rhythm and normal heart sounds.   No murmur heard. Pulmonary/Chest: She has no wheezes. She has no rales.  Psychiatric: She has a normal mood and affect.          Assessment & Plan:

## 2011-01-12 NOTE — Assessment & Plan Note (Signed)
Her infection remains under excellent control. I will continue her Atripla.

## 2011-01-13 MED ORDER — FLUOXETINE HCL 20 MG PO CAPS: 20.0000 mg | ORAL_CAPSULE | Freq: Every day | ORAL | Status: DC

## 2011-02-03 ENCOUNTER — Ambulatory Visit: Payer: 59

## 2011-02-17 ENCOUNTER — Ambulatory Visit: Payer: 59

## 2011-02-17 ENCOUNTER — Other Ambulatory Visit: Payer: Self-pay | Admitting: *Deleted

## 2011-02-17 DIAGNOSIS — B20 Human immunodeficiency virus [HIV] disease: Secondary | ICD-10-CM

## 2011-02-17 MED ORDER — EFAVIRENZ-EMTRICITAB-TENOFOVIR 600-200-300 MG PO TABS: 1.0000 | ORAL_TABLET | Freq: Every day | ORAL | Status: DC

## 2011-03-01 ENCOUNTER — Ambulatory Visit: Payer: 59 | Admitting: Internal Medicine

## 2011-03-08 ENCOUNTER — Other Ambulatory Visit: Payer: Self-pay | Admitting: *Deleted

## 2011-03-08 DIAGNOSIS — F329 Major depressive disorder, single episode, unspecified: Secondary | ICD-10-CM

## 2011-03-08 DIAGNOSIS — B20 Human immunodeficiency virus [HIV] disease: Secondary | ICD-10-CM

## 2011-03-08 DIAGNOSIS — F3289 Other specified depressive episodes: Secondary | ICD-10-CM

## 2011-03-08 MED ORDER — FLUOXETINE HCL 20 MG PO CAPS: 20.0000 mg | ORAL_CAPSULE | Freq: Every day | ORAL | Status: DC

## 2011-03-08 MED ORDER — EFAVIRENZ-EMTRICITAB-TENOFOVIR 600-200-300 MG PO TABS: 1.0000 | ORAL_TABLET | Freq: Every day | ORAL | Status: DC

## 2011-03-08 NOTE — Telephone Encounter (Signed)
Pt had called & lm about a med refill. I called the number she left ([facility]) & refilled her 2 meds that are covered by adap. Pt not available by phone

## 2011-03-17 ENCOUNTER — Encounter: Payer: Self-pay | Admitting: *Deleted

## 2011-05-05 LAB — T-HELPER CELL (CD4) - (RCID CLINIC ONLY): CD4 % Helper T Cell: 9 — ABNORMAL LOW

## 2011-05-06 LAB — T-HELPER CELL (CD4) - (RCID CLINIC ONLY)
CD4 % Helper T Cell: 17 — ABNORMAL LOW
CD4 T Cell Abs: 230 — ABNORMAL LOW

## 2011-05-10 LAB — URINALYSIS, ROUTINE W REFLEX MICROSCOPIC
Nitrite: NEGATIVE
Specific Gravity, Urine: 1.01
Urobilinogen, UA: 0.2

## 2011-05-10 LAB — URINE MICROSCOPIC-ADD ON

## 2011-05-10 LAB — CBC
Platelets: 139 — ABNORMAL LOW
RDW: 14.2

## 2011-06-20 ENCOUNTER — Other Ambulatory Visit: Payer: Self-pay

## 2011-06-20 DIAGNOSIS — B20 Human immunodeficiency virus [HIV] disease: Secondary | ICD-10-CM

## 2011-06-21 ENCOUNTER — Other Ambulatory Visit: Payer: Self-pay | Admitting: Internal Medicine

## 2011-06-21 ENCOUNTER — Other Ambulatory Visit (INDEPENDENT_AMBULATORY_CARE_PROVIDER_SITE_OTHER): Payer: Self-pay

## 2011-06-21 DIAGNOSIS — B20 Human immunodeficiency virus [HIV] disease: Secondary | ICD-10-CM

## 2011-06-21 LAB — COMPLETE METABOLIC PANEL WITH GFR
ALT: 18 U/L (ref 0–35)
Albumin: 4.1 g/dL (ref 3.5–5.2)
Alkaline Phosphatase: 101 U/L (ref 39–117)
CO2: 26 mEq/L (ref 19–32)
Potassium: 4.4 mEq/L (ref 3.5–5.3)
Sodium: 138 mEq/L (ref 135–145)
Total Bilirubin: 0.3 mg/dL (ref 0.3–1.2)
Total Protein: 7 g/dL (ref 6.0–8.3)

## 2011-06-21 LAB — CBC WITH DIFFERENTIAL/PLATELET
Basophils Relative: 0 % (ref 0–1)
Eosinophils Absolute: 0.1 10*3/uL (ref 0.0–0.7)
HCT: 34.4 % — ABNORMAL LOW (ref 36.0–46.0)
Lymphs Abs: 1.3 10*3/uL (ref 0.7–4.0)
MCH: 27.5 pg (ref 26.0–34.0)
MCV: 78.9 fL (ref 78.0–100.0)
Monocytes Relative: 8 % (ref 3–12)
Neutro Abs: 3.6 10*3/uL (ref 1.7–7.7)
Neutrophils Relative %: 66 % (ref 43–77)
Platelets: 143 10*3/uL — ABNORMAL LOW (ref 150–400)
WBC: 5.5 10*3/uL (ref 4.0–10.5)

## 2011-06-22 LAB — T-HELPER CELL (CD4) - (RCID CLINIC ONLY)
CD4 % Helper T Cell: 32 % — ABNORMAL LOW (ref 33–55)
CD4 T Cell Abs: 460 uL (ref 400–2700)

## 2011-07-05 ENCOUNTER — Ambulatory Visit: Payer: 59 | Admitting: Internal Medicine

## 2011-07-05 ENCOUNTER — Ambulatory Visit: Payer: Self-pay

## 2011-08-04 ENCOUNTER — Telehealth: Payer: Self-pay | Admitting: *Deleted

## 2011-08-04 NOTE — Telephone Encounter (Signed)
Called patient regarding overdue appointment.  She just started a job and will have insurance in February, said she will call then to schedule appointment. Wendall Mola CMA

## 2011-09-19 ENCOUNTER — Ambulatory Visit (INDEPENDENT_AMBULATORY_CARE_PROVIDER_SITE_OTHER): Payer: Managed Care, Other (non HMO) | Admitting: Internal Medicine

## 2011-09-19 ENCOUNTER — Encounter: Payer: Self-pay | Admitting: Internal Medicine

## 2011-09-19 VITALS — BP 119/86 | HR 77 | Temp 98.5°F | Ht 62.0 in | Wt 178.5 lb

## 2011-09-19 DIAGNOSIS — Z23 Encounter for immunization: Secondary | ICD-10-CM

## 2011-09-19 DIAGNOSIS — Z21 Asymptomatic human immunodeficiency virus [HIV] infection status: Secondary | ICD-10-CM

## 2011-09-19 DIAGNOSIS — B20 Human immunodeficiency virus [HIV] disease: Secondary | ICD-10-CM

## 2011-09-19 MED ORDER — EFAVIRENZ-EMTRICITAB-TENOFOVIR 600-200-300 MG PO TABS: 1.0000 | ORAL_TABLET | Freq: Every day | ORAL | Status: DC

## 2011-09-19 NOTE — Progress Notes (Signed)
Patient ID: Alyssa Wilson, female   DOB: [birth year], 41 y.o.   MRN: [id number]  INFECTIOUS DISEASE PROGRESS NOTE    Subjective: Alyssa Wilson is in for her first visit since June of last year. After that visit she did start taking fluoxetine for depression. She states that it did help but she finally got fed up with her job and quit. She now has a new job with [employer] that is much less stressful. She states that as soon as she quit her old job her depression resolved so she stopped taking the fluoxetine several months ago. She has not missed any of her HIV medication.  She has gained a great deal of weight and is concerned about that. She also states that she has been concerned about short-term memory loss but after discussing this with her it appears that this is only an issue with conversations with her husband. It appears that she does not process new information well if she is distracted. She states that she likes to receive information what it is very quiet and had a very organized manner and that sometimes her husband tends to bombard her with questions or information when she first comes home from work. When this occurs she often does not remember everything he told her the next day.  Objective: Temp: 98.5 F (36.9 C) (02/11 1604) Temp src: Oral (02/11 1604) BP: 119/86 mmHg (02/11 1604) Pulse Rate: 77  (02/11 1604)  General: She is in good spirits Skin: No rash Lungs: Clear Cor: Regular S1 and S2 no murmurs   Lab Results HIV 1 RNA Quant (copies/mL)  Date Value  06/21/2011 NOT DETECTED   12/29/2010 <20   06/08/2010 <20 copies/mL      CD4 T Cell Abs (cmm)  Date Value  06/21/2011 460   12/29/2010 490   06/08/2010 420      Assessment: Her HIV infection was under good control when her labs were last checked in November. Her adherence is excellent. I will repeat lab work today.  Her depression has resolved with a job change.  I do not believe she has any short-term memory loss.  I talked  to her about getting regular exercise.  Plan: 1. Continue current medications 2. Check a full set of lab work today and return to clinic in 6 months   Cliffton Asters, MD [facility] [phone] pager   [phone] cell 09/19/2011, 5:04 PM

## 2011-09-20 LAB — CBC
Platelets: 184 10*3/uL (ref 150–400)
RBC: 4.7 MIL/uL (ref 3.87–5.11)
RDW: 13.5 % (ref 11.5–15.5)
WBC: 7.9 10*3/uL (ref 4.0–10.5)

## 2011-09-20 LAB — COMPREHENSIVE METABOLIC PANEL
ALT: 24 U/L (ref 0–35)
AST: 19 U/L (ref 0–37)
Albumin: 4.3 g/dL (ref 3.5–5.2)
CO2: 22 mEq/L (ref 19–32)
Calcium: 8.9 mg/dL (ref 8.4–10.5)
Chloride: 106 mEq/L (ref 96–112)
Creat: 1.01 mg/dL (ref 0.50–1.10)
Potassium: 4.3 mEq/L (ref 3.5–5.3)
Sodium: 140 mEq/L (ref 135–145)
Total Protein: 7.6 g/dL (ref 6.0–8.3)

## 2011-09-20 LAB — RPR

## 2011-09-20 LAB — LIPID PANEL: Total CHOL/HDL Ratio: 3.4 Ratio

## 2011-09-21 LAB — T-HELPER CELL (CD4) - (RCID CLINIC ONLY)
CD4 % Helper T Cell: 35 % (ref 33–55)
CD4 T Cell Abs: 590 uL (ref 400–2700)

## 2011-09-23 LAB — HIV-1 RNA QUANT-NO REFLEX-BLD: HIV-1 RNA Quant, Log: <1.3 {Log} (ref <1.30)

## 2011-10-06 ENCOUNTER — Other Ambulatory Visit: Payer: Self-pay | Admitting: Family Medicine

## 2011-10-06 ENCOUNTER — Other Ambulatory Visit: Payer: Self-pay | Admitting: Obstetrics and Gynecology

## 2011-10-06 DIAGNOSIS — Z1231 Encounter for screening mammogram for malignant neoplasm of breast: Secondary | ICD-10-CM

## 2011-10-12 ENCOUNTER — Other Ambulatory Visit: Payer: Self-pay | Admitting: Obstetrics and Gynecology

## 2011-10-12 ENCOUNTER — Ambulatory Visit
Admission: RE | Admit: 2011-10-12 | Discharge: 2011-10-12 | Disposition: A | Discharge status: Home / Self Care | Payer: Managed Care, Other (non HMO) | Source: Ambulatory Visit | Attending: Obstetrics and Gynecology | Admitting: Obstetrics and Gynecology

## 2011-10-12 ENCOUNTER — Other Ambulatory Visit (HOSPITAL_COMMUNITY)
Admission: RE | Admit: 2011-10-12 | Discharge: 2011-10-12 | Disposition: A | Discharge status: Home / Self Care | Payer: Managed Care, Other (non HMO) | Source: Ambulatory Visit | Attending: Obstetrics and Gynecology | Admitting: Obstetrics and Gynecology

## 2011-10-12 DIAGNOSIS — Z1231 Encounter for screening mammogram for malignant neoplasm of breast: Secondary | ICD-10-CM

## 2011-10-12 DIAGNOSIS — Z01419 Encounter for gynecological examination (general) (routine) without abnormal findings: Secondary | ICD-10-CM | POA: Insufficient documentation

## 2012-03-07 ENCOUNTER — Telehealth: Payer: Self-pay | Admitting: *Deleted

## 2012-03-07 NOTE — Telephone Encounter (Signed)
Pt called to let Dr. Orvan Falconer know about these upcoming medical procedures.

## 2012-03-13 ENCOUNTER — Other Ambulatory Visit: Payer: Self-pay | Admitting: Licensed Clinical Social Worker

## 2012-03-13 DIAGNOSIS — B20 Human immunodeficiency virus [HIV] disease: Secondary | ICD-10-CM

## 2012-03-13 MED ORDER — EFAVIRENZ-EMTRICITAB-TENOFOVIR 600-200-300 MG PO TABS: 1.0000 | ORAL_TABLET | Freq: Every day | ORAL | Status: DC

## 2012-03-19 ENCOUNTER — Encounter (HOSPITAL_COMMUNITY)
Admission: RE | Admit: 2012-03-19 | Discharge: 2012-03-19 | Disposition: A | Discharge status: Home / Self Care | Payer: Managed Care, Other (non HMO) | Source: Ambulatory Visit | Attending: Obstetrics and Gynecology | Admitting: Obstetrics and Gynecology

## 2012-03-19 ENCOUNTER — Encounter (HOSPITAL_COMMUNITY): Payer: Self-pay

## 2012-03-19 HISTORY — DX: Asymptomatic human immunodeficiency virus (hiv) infection status: Z21

## 2012-03-19 HISTORY — DX: Anemia, unspecified: D64.9

## 2012-03-19 HISTORY — DX: Headache: R51

## 2012-03-19 HISTORY — DX: Human immunodeficiency virus (HIV) disease: B20

## 2012-03-19 LAB — CBC
HCT: 34.2 % — ABNORMAL LOW (ref 36.0–46.0)
MCHC: 34.5 g/dL (ref 30.0–36.0)
Platelets: 150 10*3/uL (ref 150–400)
RDW: 13.2 % (ref 11.5–15.5)

## 2012-03-19 LAB — SURGICAL PCR SCREEN: Staphylococcus aureus: INVALID — AB

## 2012-03-19 NOTE — Patient Instructions (Signed)
Your procedure is scheduled on:04/02/12  Enter through the Main Entrance at : 1130 am Pick up desk phone and dial [phone] and inform us of your arrival.  Please call [phone] if you have any problems the morning of surgery.  Remember: Do not eat after midnight:Sunday 04/01/12 Do not drink after:9am Monday 04/02/12  Take these meds the morning of surgery with a sip of water:none  DO NOT wear jewelry, eye make-up, lipstick,body lotion, or dark fingernail polish. Do not shave for 48 hours prior to surgery.  If you are to be admitted after surgery, leave suitcase in car until your room has been assigned. Patients discharged on the day of surgery will not be allowed to drive home.   Remember to use your Hibiclens as instructed.

## 2012-03-21 LAB — MRSA CULTURE

## 2012-03-28 ENCOUNTER — Other Ambulatory Visit: Payer: Self-pay | Admitting: Obstetrics and Gynecology

## 2012-04-02 ENCOUNTER — Ambulatory Visit (HOSPITAL_COMMUNITY)
Admission: RE | Admit: 2012-04-02 | Discharge: 2012-04-03 | Disposition: A | Discharge status: Home / Self Care | Payer: Managed Care, Other (non HMO) | Source: Ambulatory Visit | Attending: Obstetrics and Gynecology | Admitting: Obstetrics and Gynecology

## 2012-04-02 ENCOUNTER — Ambulatory Visit (HOSPITAL_COMMUNITY): Payer: Managed Care, Other (non HMO) | Admitting: Anesthesiology

## 2012-04-02 ENCOUNTER — Encounter (HOSPITAL_COMMUNITY): Payer: Self-pay | Admitting: *Deleted

## 2012-04-02 ENCOUNTER — Encounter (HOSPITAL_COMMUNITY): Payer: Self-pay | Admitting: Anesthesiology

## 2012-04-02 ENCOUNTER — Encounter (HOSPITAL_COMMUNITY)
Admission: RE | Disposition: A | Discharge status: Home / Self Care | Payer: Self-pay | Source: Ambulatory Visit | Attending: Obstetrics and Gynecology

## 2012-04-02 DIAGNOSIS — N83209 Unspecified ovarian cyst, unspecified side: Secondary | ICD-10-CM | POA: Insufficient documentation

## 2012-04-02 DIAGNOSIS — D259 Leiomyoma of uterus, unspecified: Secondary | ICD-10-CM | POA: Insufficient documentation

## 2012-04-02 DIAGNOSIS — IMO0002 Reserved for concepts with insufficient information to code with codable children: Secondary | ICD-10-CM | POA: Insufficient documentation

## 2012-04-02 DIAGNOSIS — N946 Dysmenorrhea, unspecified: Secondary | ICD-10-CM | POA: Insufficient documentation

## 2012-04-02 DIAGNOSIS — B20 Human immunodeficiency virus [HIV] disease: Secondary | ICD-10-CM

## 2012-04-02 LAB — PREGNANCY, URINE: Preg Test, Ur: NEGATIVE

## 2012-04-02 LAB — BASIC METABOLIC PANEL
CO2: 20 mEq/L (ref 19–32)
Chloride: 100 mEq/L (ref 96–112)
GFR calc Af Amer: >90 mL/min (ref >90)
Sodium: 132 mEq/L — ABNORMAL LOW (ref 135–145)

## 2012-04-02 SURGERY — ROBOTIC ASSISTED TOTAL HYSTERECTOMY
Anesthesia: General | Site: Abdomen | Wound class: Clean Contaminated

## 2012-04-02 MED ORDER — NEOSTIGMINE METHYLSULFATE 1 MG/ML IJ SOLN
INTRAMUSCULAR | Status: AC
  Filled 2012-04-02: qty 10

## 2012-04-02 MED ORDER — ONDANSETRON HCL 4 MG/2ML IJ SOLN
INTRAMUSCULAR | Status: DC | PRN
  Administered 2012-04-02: 4 mg via INTRAVENOUS

## 2012-04-02 MED ORDER — SODIUM CHLORIDE 0.9 % IJ SOLN: 9.0000 mL | INTRAMUSCULAR | Status: DC | PRN

## 2012-04-02 MED ORDER — ONDANSETRON HCL 4 MG/2ML IJ SOLN
INTRAMUSCULAR | Status: AC
  Filled 2012-04-02: qty 2

## 2012-04-02 MED ORDER — CEFAZOLIN SODIUM-DEXTROSE 2-3 GM-% IV SOLR
INTRAVENOUS | Status: AC
  Filled 2012-04-02: qty 50

## 2012-04-02 MED ORDER — ROPIVACAINE HCL 5 MG/ML IJ SOLN
INTRAMUSCULAR | Status: AC
  Filled 2012-04-02: qty 60

## 2012-04-02 MED ORDER — HYDROMORPHONE 0.3 MG/ML IV SOLN
INTRAVENOUS | Status: DC
  Administered 2012-04-02: 0.3 mg via INTRAVENOUS
  Administered 2012-04-02: 19:00:00 via INTRAVENOUS
  Administered 2012-04-03: 0.3 mg via INTRAVENOUS
  Filled 2012-04-02: qty 25

## 2012-04-02 MED ORDER — DEXAMETHASONE SODIUM PHOSPHATE 10 MG/ML IJ SOLN
INTRAMUSCULAR | Status: AC
  Filled 2012-04-02: qty 1

## 2012-04-02 MED ORDER — NEOSTIGMINE METHYLSULFATE 1 MG/ML IJ SOLN
INTRAMUSCULAR | Status: DC | PRN
  Administered 2012-04-02: 4 mg via INTRAVENOUS

## 2012-04-02 MED ORDER — DIPHENHYDRAMINE HCL 50 MG/ML IJ SOLN: 12.5000 mg | Freq: Four times a day (QID) | INTRAMUSCULAR | Status: DC | PRN

## 2012-04-02 MED ORDER — ROCURONIUM BROMIDE 50 MG/5ML IV SOLN
INTRAVENOUS | Status: AC
  Filled 2012-04-02: qty 1

## 2012-04-02 MED ORDER — EFAVIRENZ-EMTRICITAB-TENOFOVIR 600-200-300 MG PO TABS
1.0000 | ORAL_TABLET | Freq: Every day | ORAL | Status: DC
  Administered 2012-04-02 – 2012-04-03 (×2): 1 via ORAL
  Filled 2012-04-02 (×3): qty 1

## 2012-04-02 MED ORDER — PROPOFOL 10 MG/ML IV EMUL
INTRAVENOUS | Status: DC | PRN
  Administered 2012-04-02: 200 mg via INTRAVENOUS

## 2012-04-02 MED ORDER — IBUPROFEN 600 MG PO TABS: 600.0000 mg | ORAL_TABLET | Freq: Four times a day (QID) | ORAL | Status: DC | PRN

## 2012-04-02 MED ORDER — ONDANSETRON HCL 4 MG/2ML IJ SOLN: 4.0000 mg | Freq: Four times a day (QID) | INTRAMUSCULAR | Status: DC | PRN

## 2012-04-02 MED ORDER — GLYCOPYRROLATE 0.2 MG/ML IJ SOLN
INTRAMUSCULAR | Status: DC | PRN
  Administered 2012-04-02: .6 mg via INTRAVENOUS
  Administered 2012-04-02: 0.2 mg via INTRAVENOUS

## 2012-04-02 MED ORDER — GLYCOPYRROLATE 0.2 MG/ML IJ SOLN
INTRAMUSCULAR | Status: AC
  Filled 2012-04-02: qty 1

## 2012-04-02 MED ORDER — OXYCODONE-ACETAMINOPHEN 5-325 MG PO TABS
1.0000 | ORAL_TABLET | ORAL | Status: DC | PRN
  Administered 2012-04-03: 1 via ORAL
  Filled 2012-04-02: qty 1

## 2012-04-02 MED ORDER — LACTATED RINGERS IV SOLN
INTRAVENOUS | Status: DC
  Administered 2012-04-02 (×2): via INTRAVENOUS

## 2012-04-02 MED ORDER — HYDROMORPHONE HCL PF 1 MG/ML IJ SOLN
INTRAMUSCULAR | Status: AC
  Administered 2012-04-02: 0.5 mg via INTRAVENOUS
  Filled 2012-04-02: qty 1

## 2012-04-02 MED ORDER — GLYCOPYRROLATE 0.2 MG/ML IJ SOLN
INTRAMUSCULAR | Status: AC
  Filled 2012-04-02: qty 3

## 2012-04-02 MED ORDER — NALOXONE HCL 0.4 MG/ML IJ SOLN: 0.4000 mg | INTRAMUSCULAR | Status: DC | PRN

## 2012-04-02 MED ORDER — HYDROMORPHONE HCL PF 1 MG/ML IJ SOLN
0.2500 mg | INTRAMUSCULAR | Status: DC | PRN
  Administered 2012-04-02 (×2): 0.5 mg via INTRAVENOUS

## 2012-04-02 MED ORDER — KETOROLAC TROMETHAMINE 30 MG/ML IJ SOLN
30.0000 mg | Freq: Three times a day (TID) | INTRAMUSCULAR | Status: DC
  Administered 2012-04-02 – 2012-04-03 (×2): 30 mg via INTRAVENOUS
  Filled 2012-04-02: qty 1

## 2012-04-02 MED ORDER — KETOROLAC TROMETHAMINE 30 MG/ML IJ SOLN
INTRAMUSCULAR | Status: AC
  Administered 2012-04-02: 30 mg via INTRAVENOUS
  Filled 2012-04-02: qty 1

## 2012-04-02 MED ORDER — FENTANYL CITRATE 0.05 MG/ML IJ SOLN
INTRAMUSCULAR | Status: DC | PRN
  Administered 2012-04-02: 50 ug via INTRAVENOUS
  Administered 2012-04-02: 25 ug via INTRAVENOUS
  Administered 2012-04-02: 150 ug via INTRAVENOUS

## 2012-04-02 MED ORDER — DEXAMETHASONE SODIUM PHOSPHATE 4 MG/ML IJ SOLN
INTRAMUSCULAR | Status: DC | PRN
  Administered 2012-04-02: 10 mg via INTRAVENOUS

## 2012-04-02 MED ORDER — LIDOCAINE HCL (CARDIAC) 20 MG/ML IV SOLN
INTRAVENOUS | Status: DC | PRN
  Administered 2012-04-02: 60 mg via INTRAVENOUS

## 2012-04-02 MED ORDER — LIDOCAINE HCL (CARDIAC) 20 MG/ML IV SOLN
INTRAVENOUS | Status: AC
  Filled 2012-04-02: qty 5

## 2012-04-02 MED ORDER — CEFAZOLIN SODIUM-DEXTROSE 2-3 GM-% IV SOLR
2.0000 g | INTRAVENOUS | Status: AC
  Administered 2012-04-02: 2 g via INTRAVENOUS

## 2012-04-02 MED ORDER — FENTANYL CITRATE 0.05 MG/ML IJ SOLN
INTRAMUSCULAR | Status: AC
  Filled 2012-04-02: qty 5

## 2012-04-02 MED ORDER — ROPIVACAINE HCL 5 MG/ML IJ SOLN
INTRAMUSCULAR | Status: DC | PRN
  Administered 2012-04-02: 30 mL

## 2012-04-02 MED ORDER — MIDAZOLAM HCL 5 MG/5ML IJ SOLN
INTRAMUSCULAR | Status: DC | PRN
  Administered 2012-04-02: 2 mg via INTRAVENOUS

## 2012-04-02 MED ORDER — DIPHENHYDRAMINE HCL 12.5 MG/5ML PO ELIX: 12.5000 mg | ORAL_SOLUTION | Freq: Four times a day (QID) | ORAL | Status: DC | PRN

## 2012-04-02 MED ORDER — MIDAZOLAM HCL 2 MG/2ML IJ SOLN
INTRAMUSCULAR | Status: AC
  Filled 2012-04-02: qty 2

## 2012-04-02 MED ORDER — ROCURONIUM BROMIDE 100 MG/10ML IV SOLN
INTRAVENOUS | Status: DC | PRN
  Administered 2012-04-02: 50 mg via INTRAVENOUS
  Administered 2012-04-02: 20 mg via INTRAVENOUS

## 2012-04-02 MED ORDER — PROPOFOL 10 MG/ML IV EMUL
INTRAVENOUS | Status: AC
Start: 2012-04-02 — End: 2012-04-02
  Filled 2012-04-02: qty 20

## 2012-04-02 MED ORDER — FENTANYL CITRATE 0.05 MG/ML IJ SOLN
INTRAMUSCULAR | Status: AC
  Filled 2012-04-02: qty 2

## 2012-04-02 MED ORDER — LACTATED RINGERS IR SOLN
Status: DC | PRN
  Administered 2012-04-02: 3000 mL

## 2012-04-02 SURGICAL SUPPLY — 61 items
BAG URINE DRAINAGE (UROLOGICAL SUPPLIES) ×3 IMPLANT
BARRIER ADHS 3X4 INTERCEED (GAUZE/BANDAGES/DRESSINGS) ×3 IMPLANT
BENZOIN TINCTURE PRP APPL 2/3 (GAUZE/BANDAGES/DRESSINGS) ×3 IMPLANT
CABLE HIGH FREQUENCY MONO STRZ (ELECTRODE) ×3 IMPLANT
CATH FOLEY 3WAY  5CC 16FR (CATHETERS) ×1
CATH FOLEY 3WAY 5CC 16FR (CATHETERS) ×2 IMPLANT
CONT PATH 16OZ SNAP LID 3702 (MISCELLANEOUS) ×3 IMPLANT
COVER MAYO STAND STRL (DRAPES) ×3 IMPLANT
COVER TABLE BACK 60X90 (DRAPES) ×6 IMPLANT
COVER TIP SHEARS 8 DVNC (MISCELLANEOUS) ×4 IMPLANT
COVER TIP SHEARS 8MM DA VINCI (MISCELLANEOUS) ×2
DECANTER SPIKE VIAL GLASS SM (MISCELLANEOUS) ×3 IMPLANT
DERMABOND ADVANCED (GAUZE/BANDAGES/DRESSINGS) ×1
DERMABOND ADVANCED .7 DNX12 (GAUZE/BANDAGES/DRESSINGS) ×2 IMPLANT
DILATOR CANAL MILEX (MISCELLANEOUS) ×3 IMPLANT
DRAPE HUG U DISPOSABLE (DRAPE) ×3 IMPLANT
DRAPE LG THREE QUARTER DISP (DRAPES) ×6 IMPLANT
DRAPE MONITOR DA VINCI (DRAPE) IMPLANT
DRAPE WARM FLUID 44X44 (DRAPE) ×3 IMPLANT
ELECT REM PT RETURN 9FT ADLT (ELECTROSURGICAL) ×3
ELECTRODE REM PT RTRN 9FT ADLT (ELECTROSURGICAL) ×2 IMPLANT
EVACUATOR SMOKE 8.L (FILTER) ×3 IMPLANT
GLOVE BIO SURGEON STRL SZ 6.5 (GLOVE) ×9 IMPLANT
GLOVE BIOGEL M 6.5 STRL (GLOVE) ×12 IMPLANT
GLOVE BIOGEL PI IND STRL 6.5 (GLOVE) ×8 IMPLANT
GLOVE BIOGEL PI IND STRL 7.0 (GLOVE) ×8 IMPLANT
GLOVE BIOGEL PI INDICATOR 6.5 (GLOVE) ×4
GLOVE BIOGEL PI INDICATOR 7.0 (GLOVE) ×4
GLOVE ECLIPSE 6.5 STRL STRAW (GLOVE) ×12 IMPLANT
GOWN STRL REIN XL XLG (GOWN DISPOSABLE) ×18 IMPLANT
KIT ACCESSORY DA VINCI DISP (KITS) ×1
KIT ACCESSORY DVNC DISP (KITS) ×2 IMPLANT
KIT DISP ACCESSORY 4 ARM (KITS) IMPLANT
NEEDLE INSUFFLATION 14GA 120MM (NEEDLE) ×3 IMPLANT
OCCLUDER COLPOPNEUMO (BALLOONS) ×6 IMPLANT
PACK LAVH (CUSTOM PROCEDURE TRAY) ×3 IMPLANT
PAD PREP 24X48 CUFFED NSTRL (MISCELLANEOUS) ×6 IMPLANT
PLUG CATH AND CAP STER (CATHETERS) ×3 IMPLANT
PROTECTOR NERVE ULNAR (MISCELLANEOUS) ×6 IMPLANT
SET CYSTO W/LG BORE CLAMP LF (SET/KITS/TRAYS/PACK) ×3 IMPLANT
SET IRRIG TUBING LAPAROSCOPIC (IRRIGATION / IRRIGATOR) ×6 IMPLANT
SOLUTION ELECTROLUBE (MISCELLANEOUS) ×3 IMPLANT
STRIP CLOSURE SKIN 1/4X4 (GAUZE/BANDAGES/DRESSINGS) ×3 IMPLANT
SUT VIC AB 0 CT1 27 (SUTURE) ×2
SUT VIC AB 0 CT1 27XBRD ANBCTR (SUTURE) ×4 IMPLANT
SUT VIC AB 0 CT1 27XBRD ANTBC (SUTURE) IMPLANT
SUT VICRYL 0 27 CT2 27 ABS (SUTURE) ×21 IMPLANT
SUT VICRYL 0 UR6 27IN ABS (SUTURE) ×3 IMPLANT
SUT VICRYL RAPIDE 4/0 PS 2 (SUTURE) ×6 IMPLANT
SYR 30ML LL (SYRINGE) ×3 IMPLANT
SYR 50ML LL SCALE MARK (SYRINGE) ×3 IMPLANT
SYSTEM CONVERTIBLE TROCAR (TROCAR) IMPLANT
TIP UTERINE 6.7X10CM GRN DISP (MISCELLANEOUS) ×3 IMPLANT
TOWEL OR 17X24 6PK STRL BLUE (TOWEL DISPOSABLE) ×9 IMPLANT
TROCAR 12M 150ML BLUNT (TROCAR) ×3 IMPLANT
TROCAR DISP BLADELESS 8 DVNC (TROCAR) ×2 IMPLANT
TROCAR DISP BLADELESS 8MM (TROCAR) ×1
TROCAR XCEL NON-BLD 11X100MML (ENDOMECHANICALS) ×3 IMPLANT
TUBING FILTER THERMOFLATOR (ELECTROSURGICAL) ×3 IMPLANT
WARMER LAPAROSCOPE (MISCELLANEOUS) ×3 IMPLANT
WATER STERILE IRR 1000ML POUR (IV SOLUTION) ×9 IMPLANT

## 2012-04-02 NOTE — Anesthesia Procedure Notes (Signed)
Procedure Name: Intubation Date/Time: 04/02/2012 1:28 PM Performed by: Isabella Bowens R Pre-anesthesia Checklist: Patient identified, Emergency Drugs available, Suction available, Timeout performed and Patient being monitored Patient Re-evaluated:Patient Re-evaluated prior to inductionOxygen Delivery Method: Circle system utilized Preoxygenation: Pre-oxygenation with 100% oxygen Intubation Type: IV induction Ventilation: Mask ventilation without difficulty Laryngoscope Size: Mac and 3 Grade View: Grade II Tube type: Oral Tube size: 7.0 mm Number of attempts: 1 Airway Equipment and Method: Stylet Placement Confirmation: ETT inserted through vocal cords under direct vision,  positive ETCO2 and breath sounds checked- equal and bilateral Secured at: 21 cm Tube secured with: Tape Dental Injury: Teeth and Oropharynx as per pre-operative assessment  Difficulty Due To: Difficulty was unanticipated

## 2012-04-02 NOTE — Anesthesia Preprocedure Evaluation (Signed)
Anesthesia Evaluation  Patient identified by MRN, date of birth, ID band Patient awake    Reviewed: Allergy & Precautions, H&P , Patient's Chart, lab work & pertinent test results, reviewed documented beta blocker date and time   Airway Mallampati: II TM Distance: >3 FB Neck ROM: full    Dental No notable dental hx.    Pulmonary  breath sounds clear to auscultation  Pulmonary exam normal       Cardiovascular Rhythm:regular Rate:Normal     Neuro/Psych    GI/Hepatic   Endo/Other    Renal/GU      Musculoskeletal   Abdominal   Peds  Hematology  (+) anemia , REFUSES BLOOD PRODUCTS, JEHOVAH'S WITNESS  Anesthesia Other Findings   Reproductive/Obstetrics                           Anesthesia Physical Anesthesia Plan  ASA: II  Anesthesia Plan: General   Post-op Pain Management:    Induction: Intravenous  Airway Management Planned: Oral ETT  Additional Equipment:   Intra-op Plan:   Post-operative Plan:   Informed Consent: I have reviewed the patients History and Physical, chart, labs and discussed the procedure including the risks, benefits and alternatives for the proposed anesthesia with the patient or authorized representative who has indicated his/her understanding and acceptance.   Dental Advisory Given and Dental advisory given  Plan Discussed with: CRNA and Surgeon  Anesthesia Plan Comments: (  Discussed  general anesthesia, including possible nausea, instrumentation of airway, sore throat,pulmonary aspiration, etc. I asked if the were any outstanding questions, or  concerns before we proceeded. )        Anesthesia Quick Evaluation

## 2012-04-02 NOTE — Op Note (Signed)
04/02/2012  4:36 PM  PATIENT:  Alyssa Wilson  41 y.o. female  PRE-OPERATIVE DIAGNOSIS:1 Uterine Fibroids 2 Pelvic Pain 3 Dysmenorrhea 4 Dyspareunia  POST-OPERATIVE DIAGNOSIS:  * No post-op diagnosis entered *  PROCEDURE:  Procedure(s) (LRB): ROBOTIC ASSISTED TOTAL HYSTERECTOMY (N/A)  SURGEON:  Surgeon(s) and Role:    Dorien Chihuahua. Richardson Dopp, MD - Primary    * Geryl Rankins, MD - Assisting  PHYSICIAN ASSISTANT: None   ASSISTANTS: Dr. Geryl Rankins    ANESTHESIA:   general  EBL:100  Total I/O In: 1500 [I.V.:1500] Out: 250 [Urine:150; Blood:100]  BLOOD ADMINISTERED:none  DRAINS: Urinary Catheter (Foley)   LOCAL MEDICATIONS USED:  OTHER Ropivicaine   SPECIMEN:  Source of Specimen:  uterus and cervix   DISPOSITION OF SPECIMEN:  PATHOLOGY  COUNTS:  YES  TOURNIQUET:  * No tourniquets in log *  DICTATION: .Dragon Dictation  PLAN OF CARE: Admit for overnight observation  PATIENT DISPOSITION:  PACU - hemodynamically stable.   Delay start of Pharmacological VTE agent (>24hrs) due to surgical blood loss or risk of bleeding: not applicable  Indication: This is a 41 y/o with uterine fibroids, pelvic pain , menorrhagia who desires definitive therapy via hysterectomy.   Findings: Fibroid uterus. .. Simple cyst 3 cm on the left ovary . Normal right fallopian tube and ovary.. Adhesions of the colon to the posterior cul-de-sac.   Procedure: The  patient was taken to the operating room where she was placed under general anesthesia. She was placed in dorsal lithotomy position and prepped and draped in the usual sterile fashion. A weighted speculum was placed into the vagina.  A Deaver was placed anteriorly for retraction. The anterior lip of the cervix was grasped with a single-tooth tenaculum. The vaginal mucosa was injected with 2.5 cc of ropivacaine at the 2/4/ 8 and 10 oclock  positions. The uterus was sounded to 10 cm the cervix was dilated to 6 mm . 0 vicryl sutrure  placed at the 12  and 6:00 positions  Of the cervix to facilitate placement of a Rumi  uterine malignant manipulator. The manipulator was placed without difficulty. Weighted speculum and Deaver were removed .  Attention was turned to the patient's abdomen where a  12 mm skin incision was made 4 cm above the umbilicus.. A 12 mm trocar was placed under direct visualization.  The pneumoperitoneal was achieved with CO2 gas.The laparoscope was removed. 60 cc of ropivacaine were injected into the abdominal cavity. The laparoscope  was reinserted. An  8mm incision was made in the right upper quadrant and an 8 mm   trocar was placed 16 centimeters from the umbilicus.later connected to robotic arm #1). An left upper quadrant TROCAR WAS PLACED 18 cm from the umbilicus. Later connected to robotic arm #2.  Attention was turned to the right upper quadrant where in 10 mm midclavicular assistant  trocar was placed.   Once all ports had been placed under direct visualization.The laparoscope was removed and the da Vinci robotic system was thin right-sided docked.  The robotic  arms were connected to the corresponding trocars as listed above. The laparoscope  was then reinserted.  The PK bipolar cautery was placed into port #1. The monopolar scissor placed in the port #2. All instruments were directed into the pelvis under direct visualization.  Attention  was turned to the surgeons console.. The left utero-ovarian ligament was cauterized with PK and excised with scissors. The broad ligament was cauterized with PK incised with scissors.  The round ligament was cauterized with the PK incised with scissors. The anterior leaf of broad ligament was incised along the bladder reflection to the midline.  The right  utero-ovarian ligament was cauterized with PK and excised with scissors. The right broad ligament was cauterized with PK excised scissors. The left round ligament was cauterized with PK and excised with scissors. The   broad ligament  was incised  to the midline. The bladder was dissected off the lower uterine segments of the cervix via sharp and blunt dissection.The bowel adhesions in the posterior cul-de-sac were excised with laparoscopic scissors.   The uterine arteries were skeletonized bilaterally. They were cauterized with PK and transected. The KOH ring  was identified.  The anterior colpotomy was performed followed by the posterior colpotomy. The right  And left ureter was identified and noted to peristals.  The pk and scissors were removed and log tip forceps were   placed in the port #1 and the cutting needle driver was placed in to port #2. The vaginal cuff angles were closed with figure-of-eight stitches of 0 Vicryl. The remainder of the vaginal cuff was closed with interrupted 0 Vicryl Vicryl figure-of-eight sutures. The pelvis was irrigated.   Marland Kitchen.Excellent hemostasis was noted. All pelvic pedicles were examined and hemostasis was noted. All instruments  removed from the ports.  All ports were removed under direct  Visualization. The  pneumoperitoneum was released.  the fascia of the 12 mm umbilical port was closed with 0 Vicryl and  the fascia the 11 mm port was closed with 0 Vicryl. The skin incisions were closed with 4-0 Vicryl and then covered with Dermabond. Sponge lap and needle counts were correct x2.  The patient was awakened from anesthesia and taken to the recovery room in stable condition.

## 2012-04-02 NOTE — H&P (Signed)
Date of Initial H&P:03/28/2012 History reviewed, patient examined, no change in status, stable for surgery.

## 2012-04-02 NOTE — Transfer of Care (Signed)
Immediate Anesthesia Transfer of Care Note  Patient: Alyssa Wilson  Procedure(s) Performed: Procedure(s) (LRB): ROBOTIC ASSISTED TOTAL HYSTERECTOMY (N/A)  Patient Location: PACU  Anesthesia Type: General  Level of Consciousness: awake, alert  and oriented  Airway & Oxygen Therapy: Patient Spontanous Breathing and Patient connected to nasal cannula oxygen  Post-op Assessment: Report given to PACU RN  Post vital signs: Reviewed and stable  Complications: No apparent anesthesia complications

## 2012-04-02 NOTE — Anesthesia Postprocedure Evaluation (Signed)
Anesthesia Post Note  Patient: Alyssa Wilson  Procedure(s) Performed: Procedure(s) (LRB): ROBOTIC ASSISTED TOTAL HYSTERECTOMY (N/A)  Anesthesia type: General  Patient location: PACU  Post pain: Pain level controlled  Post assessment: Post-op Vital signs reviewed  Last Vitals:  Filed Vitals:   04/02/12 1730  BP: 119/68  Pulse: 72  Temp:   Resp: 16    Post vital signs: Reviewed  Level of consciousness: sedated  Complications: No apparent anesthesia complications

## 2012-04-03 ENCOUNTER — Telehealth: Payer: Self-pay | Admitting: *Deleted

## 2012-04-03 LAB — CBC
Platelets: 153 10*3/uL (ref 150–400)
RBC: 4.29 MIL/uL (ref 3.87–5.11)
RDW: 13.2 % (ref 11.5–15.5)
WBC: 10.2 10*3/uL (ref 4.0–10.5)

## 2012-04-03 MED ORDER — IBUPROFEN 600 MG PO TABS: 600.0000 mg | ORAL_TABLET | Freq: Four times a day (QID) | ORAL | Status: AC | PRN

## 2012-04-03 MED ORDER — OXYCODONE-ACETAMINOPHEN 5-325 MG PO TABS: 1.0000 | ORAL_TABLET | Freq: Four times a day (QID) | ORAL | Status: AC | PRN

## 2012-04-03 NOTE — Telephone Encounter (Signed)
Patient called to let Dr Orvan Falconer know that she is out of surgery and home. She advised she feels great and has had no problems. She also advised that Dr Beryle Quant did her Viral load and CD4 and wanted him to contact her. Advised her that if it was done in the hospital we will have access to it. But that I will let Dr Orvan Falconer know that she did have surgery and it went well.

## 2012-04-03 NOTE — Progress Notes (Signed)
Subjective: Patient reports tolerating PO, + flatus and no problems voiding.    Objective: I have reviewed patient's vital signs, intake and output, medications and labs.  General: alert and cooperative GI: normal findings: bowel sounds normal Extremities: extremities normal, atraumatic, no cyanosis or edema   Assessment/Plan: POD #1 s/p robotic assisted laparoscopic hysterectomy.   Doing well..  D/c home post op visit in 2 wks   LOS: 1 day    Davon Folta J. 04/03/2012, 11:37 AM

## 2012-04-03 NOTE — Discharge Summary (Signed)
Physician Discharge Summary  Patient ID: Alyssa Wilson MRN: [id number] DOB/AGE: [birth year] 41 y.o.  Admit date: 04/02/2012 Discharge date: 04/03/2012  Admission Diagnoses: 1 uterine fibroids 2 anemia 3 pelvic pain 4 menorrhagia   Discharge Diagnoses: Same plus s/p robotic assisted laparoscopic hysterectomy   Active Problems:  * No active hospital problems. *    Discharged Condition: stable  Hospital Course: pt was admitted for observation after undergoing robotic assisted laparoscopic hysterectomy. She did well on pod # 1 with return of bowel and bladder function. Pain is well controlled. HGB 11.5   Consults: None  Significant Diagnostic Studies: labs: HGB 11.5 on pod #1   Treatments: surgery: robotic assisted laparoscopic hysterectomy   Discharge Exam: Blood pressure 106/57, pulse 80, temperature 98.7 F (37.1 C), temperature source Oral, resp. rate 20, height 5\' 3"  (1.6 m), weight 82.555 kg (182 lb), last menstrual period 03/17/2012, SpO2 96.00%. General appearance: alert and cooperative GI: normal findings: incision healing well   Disposition: Final discharge disposition not confirmed  Discharge Orders    Future Orders Please Complete By Expires   Diet - low sodium heart healthy      Increase activity slowly      Driving Restrictions      Comments:   Avoid driving for 1 wk   Lifting restrictions      Comments:   Do not lift over 10 lbs   Sexual Activity Restrictions      Comments:   Avoid sex for 8 wks   Call MD for:  temperature >100.4      Call MD for:  persistant nausea and vomiting      Call MD for:  severe uncontrolled pain        Medication List  As of 04/03/2012 11:42 AM   TAKE these medications         diclofenac 75 MG EC tablet   Commonly known as: VOLTAREN   Take 75 mg by mouth 2 (two) times daily.      efavirenz-emtricitabine-tenofovir 600-200-300 MG per tablet   Commonly known as: ATRIPLA   Take 1 tablet by mouth daily.      Glucosamine Sulfate  1000 MG Tabs   Take 2 tablets by mouth daily.      ibuprofen 600 MG tablet   Commonly known as: ADVIL,MOTRIN   Take 1 tablet (600 mg total) by mouth every 6 (six) hours as needed (mild pain).      multivitamin tablet   Take 1 tablet by mouth daily.      oxyCODONE-acetaminophen 5-325 MG per tablet   Commonly known as: PERCOCET/ROXICET   Take 1-2 tablets by mouth every 6 (six) hours as needed (moderate to severe pain (when tolerating fluids)).      traMADol 50 MG tablet   Commonly known as: ULTRAM   Take 50 mg by mouth every 6 (six) hours as needed.           Follow-up Information    Follow up with Jessee Avers., MD. Schedule an appointment as soon as possible for a visit in 2 weeks. (post operative visit )    Contact information:   [address] [place] [address] [phone]          Signed: Jessee Avers. 04/03/2012, 11:42 AM

## 2012-04-06 ENCOUNTER — Encounter (HOSPITAL_COMMUNITY): Payer: Self-pay

## 2012-04-07 ENCOUNTER — Encounter (HOSPITAL_COMMUNITY): Payer: Self-pay | Admitting: Obstetrics and Gynecology

## 2012-04-07 ENCOUNTER — Inpatient Hospital Stay (HOSPITAL_COMMUNITY): Payer: Managed Care, Other (non HMO)

## 2012-04-07 ENCOUNTER — Inpatient Hospital Stay (HOSPITAL_COMMUNITY)
Admission: AD | Admit: 2012-04-07 | Discharge: 2012-04-11 | DRG: 863 | Disposition: A | Discharge status: Home / Self Care | Payer: Managed Care, Other (non HMO) | Source: Ambulatory Visit | Attending: Obstetrics and Gynecology | Admitting: Obstetrics and Gynecology

## 2012-04-07 DIAGNOSIS — N7093 Salpingitis and oophoritis, unspecified: Secondary | ICD-10-CM

## 2012-04-07 DIAGNOSIS — B20 Human immunodeficiency virus [HIV] disease: Secondary | ICD-10-CM

## 2012-04-07 DIAGNOSIS — R5082 Postprocedural fever: Secondary | ICD-10-CM | POA: Diagnosis present

## 2012-04-07 DIAGNOSIS — R1031 Right lower quadrant pain: Secondary | ICD-10-CM | POA: Diagnosis present

## 2012-04-07 DIAGNOSIS — Z21 Asymptomatic human immunodeficiency virus [HIV] infection status: Secondary | ICD-10-CM | POA: Diagnosis present

## 2012-04-07 DIAGNOSIS — Y838 Other surgical procedures as the cause of abnormal reaction of the patient, or of later complication, without mention of misadventure at the time of the procedure: Secondary | ICD-10-CM | POA: Diagnosis present

## 2012-04-07 DIAGNOSIS — T8140XA Infection following a procedure, unspecified, initial encounter: Principal | ICD-10-CM | POA: Diagnosis present

## 2012-04-07 LAB — CBC WITH DIFFERENTIAL/PLATELET
HCT: 34.5 % — ABNORMAL LOW (ref 36.0–46.0)
Hemoglobin: 12.1 g/dL (ref 12.0–15.0)
Lymphocytes Relative: 4 % — ABNORMAL LOW (ref 12–46)
Monocytes Absolute: 1 10*3/uL (ref 0.1–1.0)
Monocytes Relative: 5 % (ref 3–12)
Neutro Abs: 17 10*3/uL — ABNORMAL HIGH (ref 1.7–7.7)
WBC: 18.9 10*3/uL — ABNORMAL HIGH (ref 4.0–10.5)

## 2012-04-07 LAB — WET PREP, GENITAL: Trich, Wet Prep: NONE SEEN

## 2012-04-07 LAB — COMPREHENSIVE METABOLIC PANEL
BUN: 6 mg/dL (ref 6–23)
CO2: 24 mEq/L (ref 19–32)
Chloride: 99 mEq/L (ref 96–112)
Creatinine, Ser: 0.75 mg/dL (ref 0.50–1.10)
GFR calc non Af Amer: >90 mL/min (ref >90)
Glucose, Bld: 123 mg/dL — ABNORMAL HIGH (ref 70–99)
Total Bilirubin: 0.5 mg/dL (ref 0.3–1.2)

## 2012-04-07 LAB — URINALYSIS, ROUTINE W REFLEX MICROSCOPIC
Glucose, UA: NEGATIVE mg/dL
Hgb urine dipstick: NEGATIVE
Ketones, ur: NEGATIVE mg/dL
Leukocytes, UA: NEGATIVE
pH: 6.5 (ref 5.0–8.0)

## 2012-04-07 MED ORDER — ZOLPIDEM TARTRATE 5 MG PO TABS: 5.0000 mg | ORAL_TABLET | Freq: Every evening | ORAL | Status: DC | PRN

## 2012-04-07 MED ORDER — DOCUSATE SODIUM 100 MG PO CAPS
100.0000 mg | ORAL_CAPSULE | Freq: Two times a day (BID) | ORAL | Status: DC
  Administered 2012-04-07 – 2012-04-09 (×4): 100 mg via ORAL
  Filled 2012-04-07 (×2): qty 1
  Filled 2012-04-07: qty 2
  Filled 2012-04-07 (×4): qty 1

## 2012-04-07 MED ORDER — METRONIDAZOLE IN NACL 5-0.79 MG/ML-% IV SOLN: 500.0000 mg | Freq: Three times a day (TID) | INTRAVENOUS | Status: DC

## 2012-04-07 MED ORDER — IOHEXOL 300 MG/ML  SOLN
100.0000 mL | Freq: Once | INTRAMUSCULAR | Status: AC | PRN
  Administered 2012-04-07: 100 mL via INTRAVENOUS

## 2012-04-07 MED ORDER — METRONIDAZOLE 500 MG PO TABS
500.0000 mg | ORAL_TABLET | Freq: Three times a day (TID) | ORAL | Status: DC
  Administered 2012-04-07 – 2012-04-11 (×11): 500 mg via ORAL
  Filled 2012-04-07 (×12): qty 1

## 2012-04-07 MED ORDER — METRONIDAZOLE 500 MG PO TABS
500.0000 mg | ORAL_TABLET | Freq: Three times a day (TID) | ORAL | Status: DC
  Filled 2012-04-07 (×4): qty 1

## 2012-04-07 MED ORDER — GENTAMICIN SULFATE 40 MG/ML IJ SOLN
Freq: Once | INTRAMUSCULAR | Status: DC
  Filled 2012-04-07: qty 3.25

## 2012-04-07 MED ORDER — ALUM & MAG HYDROXIDE-SIMETH 200-200-20 MG/5ML PO SUSP
30.0000 mL | ORAL | Status: DC | PRN
  Administered 2012-04-10: 30 mL via ORAL
  Filled 2012-04-07: qty 30

## 2012-04-07 MED ORDER — BUTORPHANOL TARTRATE 1 MG/ML IJ SOLN
1.0000 mg | INTRAMUSCULAR | Status: DC | PRN
  Administered 2012-04-08: 1 mg via INTRAVENOUS
  Administered 2012-04-08: 2 mg via INTRAVENOUS
  Filled 2012-04-07 (×2): qty 2

## 2012-04-07 MED ORDER — CLINDAMYCIN PHOSPHATE 900 MG/50ML IV SOLN: 900.0000 mg | Freq: Three times a day (TID) | INTRAVENOUS | Status: DC

## 2012-04-07 MED ORDER — EFAVIRENZ-EMTRICITAB-TENOFOVIR 600-200-300 MG PO TABS
1.0000 | ORAL_TABLET | Freq: Every day | ORAL | Status: DC
  Administered 2012-04-08 – 2012-04-10 (×3): 1 via ORAL

## 2012-04-07 MED ORDER — ONE-DAILY MULTI VITAMINS PO TABS
1.0000 | ORAL_TABLET | Freq: Every day | ORAL | Status: DC
Start: 2012-04-07 — End: 2012-04-07

## 2012-04-07 MED ORDER — OXYCODONE-ACETAMINOPHEN 5-325 MG PO TABS
1.0000 | ORAL_TABLET | ORAL | Status: DC | PRN
  Administered 2012-04-07 (×2): 2 via ORAL
  Administered 2012-04-08: 1 via ORAL
  Administered 2012-04-08: 2 via ORAL
  Filled 2012-04-07 (×3): qty 2
  Filled 2012-04-07: qty 1

## 2012-04-07 MED ORDER — GLUCOSAMINE SULFATE 1000 MG PO TABS: 2.0000 | ORAL_TABLET | Freq: Every day | ORAL | Status: DC

## 2012-04-07 MED ORDER — ONDANSETRON HCL 4 MG PO TABS
4.0000 mg | ORAL_TABLET | Freq: Four times a day (QID) | ORAL | Status: DC | PRN
  Administered 2012-04-09 – 2012-04-10 (×2): 4 mg via ORAL
  Filled 2012-04-07 (×2): qty 1

## 2012-04-07 MED ORDER — PRENATAL MULTIVITAMIN CH
1.0000 | ORAL_TABLET | Freq: Every day | ORAL | Status: DC
  Filled 2012-04-07 (×2): qty 1

## 2012-04-07 MED ORDER — DEXTROSE 5 % IV SOLN
2.0000 g | INTRAVENOUS | Status: DC
  Filled 2012-04-07: qty 2

## 2012-04-07 MED ORDER — DEXTROSE 5 % IV SOLN
2.0000 g | INTRAVENOUS | Status: DC
  Administered 2012-04-07 – 2012-04-10 (×4): 2 g via INTRAVENOUS
  Filled 2012-04-07 (×4): qty 2

## 2012-04-07 MED ORDER — ONE-DAILY MULTI VITAMINS PO TABS: 1.0000 | ORAL_TABLET | Freq: Every day | ORAL | Status: DC

## 2012-04-07 MED ORDER — GENTAMICIN SULFATE 40 MG/ML IJ SOLN
Freq: Three times a day (TID) | INTRAMUSCULAR | Status: DC
  Filled 2012-04-07: qty 2.5

## 2012-04-07 MED ORDER — OXYCODONE-ACETAMINOPHEN 5-325 MG PO TABS: 1.0000 | ORAL_TABLET | Freq: Four times a day (QID) | ORAL | Status: DC | PRN

## 2012-04-07 MED ORDER — LACTATED RINGERS IV SOLN
INTRAVENOUS | Status: DC
  Administered 2012-04-07 – 2012-04-09 (×5): via INTRAVENOUS

## 2012-04-07 MED ORDER — ONDANSETRON HCL 4 MG/2ML IJ SOLN
4.0000 mg | Freq: Four times a day (QID) | INTRAMUSCULAR | Status: DC | PRN
  Administered 2012-04-08 (×2): 4 mg via INTRAVENOUS
  Filled 2012-04-07 (×2): qty 2

## 2012-04-07 MED ORDER — ADULT MULTIVITAMIN W/MINERALS CH
1.0000 | ORAL_TABLET | Freq: Every day | ORAL | Status: DC
  Filled 2012-04-07 (×2): qty 1

## 2012-04-07 NOTE — MAU Provider Note (Signed)
Chief Complaint: Abdominal Pain, Emesis and Fever   First Provider Initiated Contact with Patient 04/07/12 1155     SUBJECTIVE HPI: Alyssa Wilson is a 41 y.o. [id number] at POD#5 RATH on 04/02/12 who presents with 1d hx RLQ abd pain, fever/chills, nausea/vomiting. Discharged 04/03/12 in stable condition. Had BM after taking castor oil on 8/ 04/04/12 and feels rectal fullness today. Appetite decreased and last ate last night. Last took ibuprofen last night for fever 102 and Tylenol at 0430 for fever and pain. Denies dysuria, urgency, frequency, hematuria, respiratory sx or incisional pain/drainage.   Past Medical History  Diagnosis Date  . HIV (human immunodeficiency virus infection)   . Headache   . Anemia    OB History    Grav Para Term Preterm Abortions TAB SAB Ect Mult Living   3 2   1   1  2      # Outc Date GA Lbr Len/2nd Wgt Sex Del Anes PTL Lv   1 PAR            2 PAR            3 ECT              Past Surgical History  Procedure Date  . Cervical conization w/bx   . Laparoscopy abdomen diagnostic     ectopic pregnant  1998  . Abdominal hysterectomy    History   Social History  . Marital Status: Married    Spouse Name: N/A    Number of Children: N/A  . Years of Education: N/A   Occupational History  . Not on file.   Social History Main Topics  . Smoking status: Never Smoker   . Smokeless tobacco: Never Used  . Alcohol Use: No  . Drug Use: No  . Sexually Active: Yes     declined condoms   Other Topics Concern  . Not on file   Social History Narrative  . No narrative on file   No current facility-administered medications on file prior to encounter.   Current Outpatient Prescriptions on File Prior to Encounter  Medication Sig Dispense Refill  . diclofenac (VOLTAREN) 75 MG EC tablet Take 75 mg by mouth 2 (two) times daily.      Marland Kitchen efavirenz-emtricitabine-tenofovir (ATRIPLA) 600-200-300 MG per tablet Take 1 tablet by mouth daily.  30 tablet  6  . Glucosamine  Sulfate 1000 MG TABS Take 2 tablets by mouth daily.      Marland Kitchen ibuprofen (ADVIL,MOTRIN) 600 MG tablet Take 1 tablet (600 mg total) by mouth every 6 (six) hours as needed (mild pain).  30 tablet  1  . Multiple Vitamin (MULTIVITAMIN) tablet Take 1 tablet by mouth daily.        Marland Kitchen oxyCODONE-acetaminophen (PERCOCET/ROXICET) 5-325 MG per tablet Take 1-2 tablets by mouth every 6 (six) hours as needed (moderate to severe pain (when tolerating fluids)).  30 tablet  0  . traMADol (ULTRAM) 50 MG tablet Take 50 mg by mouth every 6 (six) hours as needed.       No Known Allergies  ROS: Pertinent items in HPI  OBJECTIVE Blood pressure 119/79, pulse 121, temperature 103 F (39.4 C), temperature source Oral, resp. rate 20, height 5\' 3"  (1.6 m), weight 81.012 kg (178 lb 9.6 oz), last menstrual period 03/17/2012, SpO2 97.00%. GENERAL: Well-developed, well-nourished female in moderate apparent pain holding rt hip flexed.  HEENT: Normocephalic HEART: normal rate RESP: normal effort ABDOMEN: Soft, ND, BS hypoactive, incisions healing well.  TTP at [facility] pt with some guarding and + rebound. Minimally tender rt lower back/flank BACK: neg CVAT EXTREMITIES: Nontender, no edema NEURO: Alert and oriented   LAB RESULTS Results for orders placed during the hospital encounter of 04/07/12 (from the past 24 hour(s))  URINALYSIS, ROUTINE W REFLEX MICROSCOPIC     Status: Normal   Collection Time   04/07/12 11:20 AM      Component Value Range   Color, Urine YELLOW  YELLOW   APPearance CLEAR  CLEAR   Specific Gravity, Urine 1.010  1.005 - 1.030   pH 6.5  5.0 - 8.0   Glucose, UA NEGATIVE  NEGATIVE mg/dL   Hgb urine dipstick NEGATIVE  NEGATIVE   Bilirubin Urine NEGATIVE  NEGATIVE   Ketones, ur NEGATIVE  NEGATIVE mg/dL   Protein, ur NEGATIVE  NEGATIVE mg/dL   Urobilinogen, UA 0.2  0.0 - 1.0 mg/dL   Nitrite NEGATIVE  NEGATIVE   Leukocytes, UA NEGATIVE  NEGATIVE  CBC WITH DIFFERENTIAL     Status: Abnormal    Collection Time   04/07/12 11:50 AM      Component Value Range   WBC 18.9 (*) 4.0 - 10.5 K/uL   RBC 4.43  3.87 - 5.11 MIL/uL   Hemoglobin 12.1  12.0 - 15.0 g/dL   HCT 45.4 (*) 09.8 - 11.9 %   MCV 77.9 (*) 78.0 - 100.0 fL   MCH 27.3  26.0 - 34.0 pg   MCHC 35.1  30.0 - 36.0 g/dL   RDW 14.7  82.9 - 56.2 %   Platelets 145 (*) 150 - 400 K/uL   Neutrophils Relative 90 (*) 43 - 77 %   Neutro Abs 17.0 (*) 1.7 - 7.7 K/uL   Lymphocytes Relative 4 (*) 12 - 46 %   Lymphs Abs 0.8  0.7 - 4.0 K/uL   Monocytes Relative 5  3 - 12 %   Monocytes Absolute 1.0  0.1 - 1.0 K/uL   Eosinophils Relative 0  0 - 5 %   Eosinophils Absolute 0.0  0.0 - 0.7 K/uL   Basophils Relative 0  0 - 1 %   Basophils Absolute 0.0  0.0 - 0.1 K/uL   CMP pending IMAGING No results found.  ASSESSMENT No diagnosis found.  PLAN C/W Dr. Chilton Si: Admit Abd/pelvis CT w and wo contrast ordered    Danae Orleans, CNM 04/07/2012  12:27 PM

## 2012-04-07 NOTE — Progress Notes (Signed)
ANTIBIOTIC CONSULT NOTE - INITIAL  Pharmacy Consult for Gentamicin Indication: Fever s/p robotic hysterectomy 5 days ago  No Known Allergies  Patient Measurements: Height: 5\' 3"  (160 cm) Weight: 178 lb 9.6 oz (81.012 kg) IBW/kg (Calculated) : 52.4 kg Adjusted Body Weight: 61 kg  Vital Signs: Temp: 99.9 F (37.7 C) (08/31 1315) Temp src: Oral (08/31 1315) BP: 123/85 mmHg (08/31 1315) Pulse Rate: 99  (08/31 1315)  Labs:  Basename 04/07/12 1157 04/07/12 1150  WBC -- 18.9*  HGB -- 12.1  PLT -- 145*  LABCREA -- --  CREATININE 0.75 --  CRCLEARANCE -- --   CrCl approximately 81 ml/min   Microbiology: Recent Results (from the past 720 hour(s))  MRSA CULTURE     Status: Normal   Collection Time   03/19/12  8:50 AM      Component Value Range Status Comment   Specimen Description NOSE   Final    Special Requests NONE   Final    Culture     Final    Value: NO STAPHYLOCOCCUS AUREUS ISOLATED     Note: NOMRSA   Report Status 03/21/2012 FINAL   Final   SURGICAL PCR SCREEN     Status: Abnormal   Collection Time   03/19/12  9:05 AM      Component Value Range Status Comment   MRSA, PCR INVALID RESULTS, SPECIMEN SENT FOR CULTURE (*) NEGATIVE Final    Staphylococcus aureus INVALID RESULTS, SPECIMEN SENT FOR CULTURE (*) NEGATIVE Final     Medications:  Clindamycin 900 mg IV Q8hr  Assessment: 41 y.o. female s/p robotic hysterectomy on 04/02/12 now presenting with fever, nausea & vomiting.  CrCl is estimated to be 81 ml/min. Estimated Ke =  0.245, Vd = 0.3 L/kg  Goal of Therapy:  Gentamicin peak 6-8 mg/L and Trough < 1 mg/L  Plan:  Gentamicin 130 mg IV x 1  Gentamicin 100 mg IV every 8 hrs  Will check gentamicin levels if continued > 72hr or clinically indicated.  Natasha Bence 04/07/2012,1:38 PM

## 2012-04-07 NOTE — MAU Note (Signed)
Patient states she had Davinci robotic hysterectomy on 8-26. Started having some abdominal pain on 8-28 when walking up the stairs. Pain increased to severe last night but not as bad this am. Denies any bleeding or vaginal discharge. Has had nausea and fever since this am. Temp at home about 20 minutes ago was 102.3.

## 2012-04-07 NOTE — Consult Note (Signed)
[facility]    Date of Admission:  04/07/2012  Date of Consult:  04/07/2012  Reason for Consult:Postoperative fevers Referring Physician: Dr. Chilton Si   HPI: Alyssa Wilson is an 41 y.o. female with HIV that has been perfectly well controlled with atripla who had recent robotic hysterectomy for uterine fibroids this past Monday. In the last 24 hours developed acute onset of fevers, nausea, voming and RLQ pain. She has been admitted to [facility]. UA was completely clean. Pelvic exam showed many bacteria but no clue cells, yeast or trichomonas, GC and chlamydia are pending. BLood cultures were obtained. CT shows left sided cyst that pt was aware of and large cystic structure near RIght ovary thought by radiology to be cyst vs TOA. We were consulted to assist in workup of this pt with postoperative fevers. As of time of typing of his note pt has also had TV US that still does not sound conclusive for diff between cyst and TOA.    Past Medical History  Diagnosis Date  . HIV (human immunodeficiency virus infection)   . Headache   . Anemia     Past Surgical History  Procedure Date  . Cervical conization w/bx   . Laparoscopy abdomen diagnostic     ectopic pregnant  1998  . Abdominal hysterectomy   ergies:   No Known Allergies   Medications: I have reviewed patients current medications as documented in Epic Anti-infectives     Start     Dose/Rate Route Frequency Ordered Stop   04/07/12 2200   gentamicin (GARAMYCIN) 100 mg, clindamycin (CLEOCIN) 900 mg in dextrose 5 % 100 mL IVPB  Status:  Discontinued        217 mL/hr over 30 Minutes Intravenous 3 times per day 04/07/12 1337 04/07/12 1431   04/07/12 2200   metroNIDAZOLE (FLAGYL) tablet 500 mg        500 mg Oral 3 times per day 04/07/12 1817     04/07/12 2000   cefTRIAXone (ROCEPHIN) 2 g in dextrose 5 % 50 mL IVPB        2 g 100 mL/hr over 30 Minutes Intravenous Every 24 hours 04/07/12 1814     04/07/12 2000   metroNIDAZOLE (FLAGYL) IVPB 500 mg  Status:  Discontinued        500 mg 100 mL/hr over 60 Minutes Intravenous Every 8 hours 04/07/12 1814 04/07/12 1817   04/07/12 1815  efavirenz-emtricitabine-tenofovir (ATRIPLA) 600-200-300 MG per tablet 1 tablet       1 tablet Oral Daily 04/07/12 1813     04/07/12 1445   cefTRIAXone (ROCEPHIN) 2 g in dextrose 5 % 50 mL IVPB  Status:  Discontinued        2 g 100 mL/hr over 30 Minutes Intravenous Every 24 hours 04/07/12 1433 04/07/12 1748   04/07/12 1445   metroNIDAZOLE (FLAGYL) tablet 500 mg  Status:  Discontinued        500 mg Oral 3 times per day 04/07/12 1434 04/07/12 1748   04/07/12 1400   clindamycin (CLEOCIN) IVPB 900 mg  Status:  Discontinued        900 mg 100 mL/hr over 30 Minutes Intravenous 3 times per day 04/07/12 1325 04/07/12 1335   04/07/12 1400   gentamicin (GARAMYCIN) 130 mg, clindamycin (CLEOCIN) 900 mg in dextrose 5 % 100 mL IVPB  Status:  Discontinued        218.5 mL/hr over 30 Minutes Intravenous  Once 04/07/12 1337  04/07/12 1431          Social History:  reports that she has never smoked. She has never used smokeless tobacco. She reports that she does not drink alcohol or use illicit drugs.  History reviewed. No pertinent family history.  As in HPI and primary teams notes otherwise 12 point review of systems is negative  Blood pressure 115/76, pulse 88, temperature 99.7 F (37.6 C), temperature source Oral, resp. rate 18, height 5\' 3"  (1.6 m), weight 178 lb 9.6 oz (81.012 kg), last menstrual period 03/17/2012, SpO2 98.00%. General: Alert and awake, oriented x3, not in any acute distress. HEENT: anicteric sclera, pupils reactive to light and accommodation, EOMI, oropharynx clear and without exudate CVS regular rate, normal r,  no murmur rubs or gallops Chest: clear to auscultation bilaterally, no wheezing, rales or rhonchi Abdomen: soft tender to palpation in RLQ without rebound, nondistended, normal bowel  sounds, Extremities: no  clubbing or edema noted bilaterally Skin: surgical sites are clean Neuro: nonfocal, strength and sensation intact   Results for orders placed during the hospital encounter of 04/07/12 (from the past 48 hour(s))  URINALYSIS, ROUTINE W REFLEX MICROSCOPIC     Status: Normal   Collection Time   04/07/12 11:20 AM      Component Value Range Comment   Color, Urine YELLOW  YELLOW    APPearance CLEAR  CLEAR    Specific Gravity, Urine 1.010  1.005 - 1.030    pH 6.5  5.0 - 8.0    Glucose, UA NEGATIVE  NEGATIVE mg/dL    Hgb urine dipstick NEGATIVE  NEGATIVE    Bilirubin Urine NEGATIVE  NEGATIVE    Ketones, ur NEGATIVE  NEGATIVE mg/dL    Protein, ur NEGATIVE  NEGATIVE mg/dL    Urobilinogen, UA 0.2  0.0 - 1.0 mg/dL    Nitrite NEGATIVE  NEGATIVE    Leukocytes, UA NEGATIVE  NEGATIVE MICROSCOPIC NOT DONE ON URINES WITH NEGATIVE PROTEIN, BLOOD, LEUKOCYTES, NITRITE, OR GLUCOSE <1000 mg/dL.  CBC WITH DIFFERENTIAL     Status: Abnormal   Collection Time   04/07/12 11:50 AM      Component Value Range Comment   WBC 18.9 (*) 4.0 - 10.5 K/uL    RBC 4.43  3.87 - 5.11 MIL/uL    Hemoglobin 12.1  12.0 - 15.0 g/dL    HCT 98.1 (*) 19.1 - 46.0 %    MCV 77.9 (*) 78.0 - 100.0 fL    MCH 27.3  26.0 - 34.0 pg    MCHC 35.1  30.0 - 36.0 g/dL    RDW 47.8  29.5 - 62.1 %    Platelets 145 (*) 150 - 400 K/uL    Neutrophils Relative 90 (*) 43 - 77 %    Neutro Abs 17.0 (*) 1.7 - 7.7 K/uL    Lymphocytes Relative 4 (*) 12 - 46 %    Lymphs Abs 0.8  0.7 - 4.0 K/uL    Monocytes Relative 5  3 - 12 %    Monocytes Absolute 1.0  0.1 - 1.0 K/uL    Eosinophils Relative 0  0 - 5 %    Eosinophils Absolute 0.0  0.0 - 0.7 K/uL    Basophils Relative 0  0 - 1 %    Basophils Absolute 0.0  0.0 - 0.1 K/uL   COMPREHENSIVE METABOLIC PANEL     Status: Abnormal   Collection Time   04/07/12 11:57 AM      Component Value Range Comment  Sodium 135  135 - 145 mEq/L    Potassium 3.7  3.5 - 5.1 mEq/L    Chloride 99   96 - 112 mEq/L    CO2 24  19 - 32 mEq/L    Glucose, Bld 123 (*) 70 - 99 mg/dL    BUN 6  6 - 23 mg/dL    Creatinine, Ser 7.82  0.50 - 1.10 mg/dL    Calcium 8.8  8.4 - 95.6 mg/dL    Total Protein 7.3  6.0 - 8.3 g/dL    Albumin 3.4 (*) 3.5 - 5.2 g/dL    AST 14  0 - 37 U/L    ALT 13  0 - 35 U/L    Alkaline Phosphatase 113  39 - 117 U/L    Total Bilirubin 0.5  0.3 - 1.2 mg/dL    GFR calc non Af Amer >90  >90 mL/min    GFR calc Af Amer >90  >90 mL/min   WET PREP, GENITAL     Status: Abnormal   Collection Time   04/07/12 12:55 PM      Component Value Range Comment   Yeast Wet Prep HPF POC NONE SEEN  NONE SEEN    Trich, Wet Prep NONE SEEN  NONE SEEN    Clue Cells Wet Prep HPF POC NONE SEEN  NONE SEEN    WBC, Wet Prep HPF POC MANY (*) NONE SEEN MANY BACTERIA SEEN      Component Value Date/Time   SDES NOSE 03/19/2012 0850   SPECREQUEST NONE 03/19/2012 0850   CULT  Value: NO STAPHYLOCOCCUS AUREUS ISOLATED Note: NOMRSA 03/19/2012 0850   REPTSTATUS 03/21/2012 FINAL 03/19/2012 0850   US Transvaginal Non-ob  04/07/2012  *RADIOLOGY REPORT*  Clinical Data:  Pelvic pain 5 days post hysterectomy, abnormal appearance of the right ovary on CT  TRANSABDOMINAL AND TRANSVAGINAL ULTRASOUND OF PELVIS DOPPLER ULTRASOUND OF OVARIES  Technique:  Both transabdominal and transvaginal ultrasound examinations of the pelvis were performed. Transabdominal technique was performed for global imaging of the pelvis including uterus, ovaries, adnexal regions, and pelvic cul-de-sac.  It was necessary to proceed with endovaginal exam following the transabdominal exam to visualize the ovaries.  Color and duplex Doppler ultrasound was utilized to evaluate blood flow to the ovaries.  Comparison:  CT pelvis 04/07/2012  Findings:  Uterus:  Surgically absent  Endometrium:  N/A  Right ovary: 4.6 x 3.8 x 2.9 cm.  No focal solid mass lesion. Crescentic hypoechoic collection along the posterior ovary on trans vaginal images, 3.1 x 3.7 x 1.4  cm, question ovarian cyst versus crescentic paraovarian fluid collection.  Internal blood flow present within right ovary on color Doppler imaging.  Left ovary:   2.4 x 1.6 x 1.7 cm.  Small follicles cyst 1.4 cm greatest size.  Internal blood flow present within left ovary on pulse Doppler imaging.  Pulsed Doppler evaluation demonstrates normal low-resistance arterial and venous waveforms in both ovaries.  Trace free pelvic fluid. No other adnexal masses; specifically no hydrosalpinx visualized.  IMPRESSION: Post hysterectomy. Tiny left ovarian cyst. No evidence of ovarian torsion. Crescentic hypoechoic probable fluid collection along the posterior margin of the right ovary,3.7 x 3.1 x 1.4 cm, uncertain if intraovarian or paraovarian in position. Differential diagnosis includes right ovarian cyst, postoperative paraovarian fluid collection such as hematoma or seroma, and abscess. This may require follow-up CT or sonographic imaging to determine significance.   Original Report Authenticated By: Lollie Marrow, M.D.    US Pelvis Complete  04/07/2012  *RADIOLOGY REPORT*  Clinical Data:  Pelvic pain 5 days post hysterectomy, abnormal appearance of the right ovary on CT  TRANSABDOMINAL AND TRANSVAGINAL ULTRASOUND OF PELVIS DOPPLER ULTRASOUND OF OVARIES  Technique:  Both transabdominal and transvaginal ultrasound examinations of the pelvis were performed. Transabdominal technique was performed for global imaging of the pelvis including uterus, ovaries, adnexal regions, and pelvic cul-de-sac.  It was necessary to proceed with endovaginal exam following the transabdominal exam to visualize the ovaries.  Color and duplex Doppler ultrasound was utilized to evaluate blood flow to the ovaries.  Comparison:  CT pelvis 04/07/2012  Findings:  Uterus:  Surgically absent  Endometrium:  N/A  Right ovary: 4.6 x 3.8 x 2.9 cm.  No focal solid mass lesion. Crescentic hypoechoic collection along the posterior ovary on trans vaginal  images, 3.1 x 3.7 x 1.4 cm, question ovarian cyst versus crescentic paraovarian fluid collection.  Internal blood flow present within right ovary on color Doppler imaging.  Left ovary:   2.4 x 1.6 x 1.7 cm.  Small follicles cyst 1.4 cm greatest size.  Internal blood flow present within left ovary on pulse Doppler imaging.  Pulsed Doppler evaluation demonstrates normal low-resistance arterial and venous waveforms in both ovaries.  Trace free pelvic fluid. No other adnexal masses; specifically no hydrosalpinx visualized.  IMPRESSION: Post hysterectomy. Tiny left ovarian cyst. No evidence of ovarian torsion. Crescentic hypoechoic probable fluid collection along the posterior margin of the right ovary,3.7 x 3.1 x 1.4 cm, uncertain if intraovarian or paraovarian in position. Differential diagnosis includes right ovarian cyst, postoperative paraovarian fluid collection such as hematoma or seroma, and abscess. This may require follow-up CT or sonographic imaging to determine significance.   Original Report Authenticated By: Lollie Marrow, M.D.    Ct Abdomen Pelvis W Contrast  04/07/2012  *RADIOLOGY REPORT*  Clinical Data:  Post abdominal hysterectomy, right lower quadrant pain, leukocytosis, question abscess  CT ABDOMEN AND PELVIS WITH CONTRAST  Technique:  Multidetector CT imaging of the abdomen and pelvis was performed following the standard protocol during bolus administration of intravenous contrast. Sagittal and coronal MPR images reconstructed from axial data set.  Contrast: OMNIPAQUE IOHEXOL 300 MG/ML  SOLN Dilute oral contrast.  Comparison: None  Findings: Lung bases clear. Liver, spleen, pancreas, kidneys, and adrenal glands normal appearance. Stomach decompressed, suboptimally visualized. Tiny umbilical hernia containing fat. Bladder only partially distended. Uterus surgically absent.  Low free fluid dependently in pelvis with diffuse stranding of pelvic tissue planes. Focal low attenuation collection in  the left pelvis 2.4 x 1.9 cm likely represents a small left ovarian cyst. Larger complex collection is identified in right pelvis, 4.0 x 4.8 x 5.1 cm in size. A portion of this is low attenuation with a slightly thick rim, overall measuring 3.0 x 1.8 cm.  Mild bowel wall thickening of the distal sigmoid colon and a small bowel loop in the right pelvis. Retro cecal appendix, normal appearance. Remaining bowel loops normal. No free intraperitoneal air.  IMPRESSION: Postsurgical changes of hysterectomy with a small amount of low attenuation free pelvic fluid and diffuse stranding of pelvic tissue planes. Probable small left ovarian cyst. More complex soft tissue fluid attenuation collection in the right pelvis likely represents an enlarged edematous right ovary. Differential diagnosis would include postoperative changes with ovarian edema, tubo-ovarian abscess, cannot exclude ovarian torsion. The 3 cm low attenuation collection at the lateral margin of the enlarged right ovary may be related to cyst or hydrosalpinx but small abscess collection  is not excluded.  Recommend follow-up sonographic assessment to exclude ovarian torsion.  Findings called to Vernona Rieger RN on 04/07/2012 at 1721 hours.   Original Report Authenticated By: Lollie Marrow, M.D.    [place] Art/ven Flow Abd Pelv Doppler  04/07/2012  *RADIOLOGY REPORT*  Clinical Data:  Pelvic pain 5 days post hysterectomy, abnormal appearance of the right ovary on CT  TRANSABDOMINAL AND TRANSVAGINAL ULTRASOUND OF PELVIS DOPPLER ULTRASOUND OF OVARIES  Technique:  Both transabdominal and transvaginal ultrasound examinations of the pelvis were performed. Transabdominal technique was performed for global imaging of the pelvis including uterus, ovaries, adnexal regions, and pelvic cul-de-sac.  It was necessary to proceed with endovaginal exam following the transabdominal exam to visualize the ovaries.  Color and duplex Doppler ultrasound was utilized to evaluate blood flow to the  ovaries.  Comparison:  CT pelvis 04/07/2012  Findings:  Uterus:  Surgically absent  Endometrium:  N/A  Right ovary: 4.6 x 3.8 x 2.9 cm.  No focal solid mass lesion. Crescentic hypoechoic collection along the posterior ovary on trans vaginal images, 3.1 x 3.7 x 1.4 cm, question ovarian cyst versus crescentic paraovarian fluid collection.  Internal blood flow present within right ovary on color Doppler imaging.  Left ovary:   2.4 x 1.6 x 1.7 cm.  Small follicles cyst 1.4 cm greatest size.  Internal blood flow present within left ovary on pulse Doppler imaging.  Pulsed Doppler evaluation demonstrates normal low-resistance arterial and venous waveforms in both ovaries.  Trace free pelvic fluid. No other adnexal masses; specifically no hydrosalpinx visualized.  IMPRESSION: Post hysterectomy. Tiny left ovarian cyst. No evidence of ovarian torsion. Crescentic hypoechoic probable fluid collection along the posterior margin of the right ovary,3.7 x 3.1 x 1.4 cm, uncertain if intraovarian or paraovarian in position. Differential diagnosis includes right ovarian cyst, postoperative paraovarian fluid collection such as hematoma or seroma, and abscess. This may require follow-up CT or sonographic imaging to determine significance.   Original Report Authenticated By: Lollie Marrow, M.D.      Recent Results (from the past 720 hour(s))  MRSA CULTURE     Status: Normal   Collection Time   03/19/12  8:50 AM      Component Value Range Status Comment   Specimen Description NOSE   Final    Special Requests NONE   Final    Culture     Final    Value: NO STAPHYLOCOCCUS AUREUS ISOLATED     Note: NOMRSA   Report Status 03/21/2012 FINAL   Final   SURGICAL PCR SCREEN     Status: Abnormal   Collection Time   03/19/12  9:05 AM      Component Value Range Status Comment   MRSA, PCR INVALID RESULTS, SPECIMEN SENT FOR CULTURE (*) NEGATIVE Final    Staphylococcus aureus INVALID RESULTS, SPECIMEN SENT FOR CULTURE (*) NEGATIVE Final    WET PREP, GENITAL     Status: Abnormal   Collection Time   04/07/12 12:55 PM      Component Value Range Status Comment   Yeast Wet Prep HPF POC NONE SEEN  NONE SEEN Final    Trich, Wet Prep NONE SEEN  NONE SEEN Final    Clue Cells Wet Prep HPF POC NONE SEEN  NONE SEEN Final    WBC, Wet Prep HPF POC MANY (*) NONE SEEN Final MANY BACTERIA SEEN     Impression/Recommendation 41 yo with HIV, recent robotic hysterectomy and now fevers, nausea, vomting and RLQ pain and  possible TOA on CT and TV US.  1) Postoperative fevers: Given CT and US findings with above symptoms I have high level of conviction that this is a TOA and not simply an ovarian cyst.  I have discussed case with Dr. Chilton Si and we will proceed with rocephin and flagyl to rx empirically for TOA before possible drainage  Please make sure that if abscess is accessed by ir that bacterial cultures are sent from this  2) HIV : Has been perfectly controlled. She states that recent labs wree drawn in Gyn clinic -continue atripla   Thank you so much for this interesting consult  [facility] [phone] (pager) [phone] (office) 04/07/2012, 7:51 PM  Paulette Blanch Dam 04/07/2012, 7:51 PM

## 2012-04-07 NOTE — H&P (Addendum)
Alyssa Wilson is an 41 y.o. female.with HIV who is s/p robotic hysterectomy by Dr Richardson Dopp on April 02, 2012.  Complains of Nausea, vomiting for one day.  Felt hot and temperature was 102 and came to the hospital. Pertinent Gynecological History::  See PA note  Menstrual His Patient's last menstrual period was 03/17/2012.    Past Medical History  Diagnosis Date  . HIV (human immunodeficiency virus infection)   . Headache   . Anemia     Past Surgical History  Procedure Date  . Cervical conization w/bx   . Laparoscopy abdomen diagnostic     ectopic pregnant  1998  . Abdominal hysterectomy     History reviewed. No pertinent family history.  Social History:  reports that she has never smoked. She has never used smokeless tobacco. She reports that she does not drink alcohol or use illicit drugs.  Allergies: No Known Allergies  Prescriptions prior to admission  Medication Sig Dispense Refill  . diclofenac (VOLTAREN) 75 MG EC tablet Take 75 mg by mouth 2 (two) times daily.      Marland Kitchen efavirenz-emtricitabine-tenofovir (ATRIPLA) 600-200-300 MG per tablet Take 1 tablet by mouth daily.  30 tablet  6  . Glucosamine Sulfate 1000 MG TABS Take 2 tablets by mouth daily.      Marland Kitchen ibuprofen (ADVIL,MOTRIN) 600 MG tablet Take 1 tablet (600 mg total) by mouth every 6 (six) hours as needed (mild pain).  30 tablet  1  . Multiple Vitamin (MULTIVITAMIN) tablet Take 1 tablet by mouth daily.        Marland Kitchen OVER THE COUNTER MEDICATION Take 1 tablet by mouth as needed. Over the counter stool softener for constipation      . oxyCODONE-acetaminophen (PERCOCET/ROXICET) 5-325 MG per tablet Take 1-2 tablets by mouth every 6 (six) hours as needed (moderate to severe pain (when tolerating fluids)).  30 tablet  0  . traMADol (ULTRAM) 50 MG tablet Take 50 mg by mouth every 6 (six) hours as needed.        ROS as above  Blood pressure 119/79, pulse 121, temperature 103 F (39.4 C), temperature source Oral, resp. rate 20,  height 5\' 3"  (1.6 m), weight 178 lb 9.6 oz (81.012 kg), last menstrual period 03/17/2012, SpO2 97.00%. HEENT nl Temp 103 Heart  S1 S2 clear Lungs Clear abd BS hypoactive  Tenderness to deep palpation on lower right abdomen No rebound tenderness Pelvic  Tenderness , slight fullness in RLQ, no mass felt Minimal discharge Ext  Normal  Assessment:  5 days s/p robotic hysterectomy with nausea, RLQ pain, fever HIV P: Admit I V antibiotics, ID consult CBC Diff, CMP CT scan rule out abcess Urine and blood cultures ordered.   Results for orders placed during the hospital encounter of 04/07/12 (from the past 24 hour(s))  URINALYSIS, ROUTINE W REFLEX MICROSCOPIC     Status: Normal   Collection Time   04/07/12 11:20 AM      Component Value Range   Color, Urine YELLOW  YELLOW   APPearance CLEAR  CLEAR   Specific Gravity, Urine 1.010  1.005 - 1.030   pH 6.5  5.0 - 8.0   Glucose, UA NEGATIVE  NEGATIVE mg/dL   Hgb urine dipstick NEGATIVE  NEGATIVE   Bilirubin Urine NEGATIVE  NEGATIVE   Ketones, ur NEGATIVE  NEGATIVE mg/dL   Protein, ur NEGATIVE  NEGATIVE mg/dL   Urobilinogen, UA 0.2  0.0 - 1.0 mg/dL   Nitrite NEGATIVE  NEGATIVE   Leukocytes,  UA NEGATIVE  NEGATIVE  CBC WITH DIFFERENTIAL     Status: Abnormal   Collection Time   04/07/12 11:50 AM      Component Value Range   WBC 18.9 (*) 4.0 - 10.5 K/uL   RBC 4.43  3.87 - 5.11 MIL/uL   Hemoglobin 12.1  12.0 - 15.0 g/dL   HCT 30.8 (*) 65.7 - 84.6 %   MCV 77.9 (*) 78.0 - 100.0 fL   MCH 27.3  26.0 - 34.0 pg   MCHC 35.1  30.0 - 36.0 g/dL   RDW 96.2  95.2 - 84.1 %   Platelets 145 (*) 150 - 400 K/uL   Neutrophils Relative 90 (*) 43 - 77 %   Neutro Abs 17.0 (*) 1.7 - 7.7 K/uL   Lymphocytes Relative 4 (*) 12 - 46 %   Lymphs Abs 0.8  0.7 - 4.0 K/uL   Monocytes Relative 5  3 - 12 %   Monocytes Absolute 1.0  0.1 - 1.0 K/uL   Eosinophils Relative 0  0 - 5 %   Eosinophils Absolute 0.0  0.0 - 0.7 K/uL   Basophils Relative 0  0 - 1 %    Basophils Absolute 0.0  0.0 - 0.1 K/uL  COMPREHENSIVE METABOLIC PANEL     Status: Abnormal   Collection Time   04/07/12 11:57 AM      Component Value Range   Sodium 135  135 - 145 mEq/L   Potassium 3.7  3.5 - 5.1 mEq/L   Chloride 99  96 - 112 mEq/L   CO2 24  19 - 32 mEq/L   Glucose, Bld 123 (*) 70 - 99 mg/dL   BUN 6  6 - 23 mg/dL   Creatinine, Ser 3.24  0.50 - 1.10 mg/dL   Calcium 8.8  8.4 - 40.1 mg/dL   Total Protein 7.3  6.0 - 8.3 g/dL   Albumin 3.4 (*) 3.5 - 5.2 g/dL   AST 14  0 - 37 U/L   ALT 13  0 - 35 U/L   Alkaline Phosphatase 113  39 - 117 U/L   Total Bilirubin 0.5  0.3 - 1.2 mg/dL   GFR calc non Af Amer >90  >90 mL/min   GFR calc Af Amer >90  >90 mL/min    No results found.  Alyssa Wilson E 04/07/2012, 1:01 PM

## 2012-04-07 NOTE — MAU Note (Signed)
Dr. Neva Seat does not want patient to have anything by mouth at this time except for contrast for CT scan.

## 2012-04-08 LAB — CBC WITH DIFFERENTIAL/PLATELET
Basophils Absolute: 0 10*3/uL (ref 0.0–0.1)
Basophils Relative: 0 % (ref 0–1)
Hemoglobin: 10.3 g/dL — ABNORMAL LOW (ref 12.0–15.0)
Lymphocytes Relative: 7 % — ABNORMAL LOW (ref 12–46)
MCHC: 35.3 g/dL (ref 30.0–36.0)
Monocytes Relative: 8 % (ref 3–12)
Neutro Abs: 12.9 10*3/uL — ABNORMAL HIGH (ref 1.7–7.7)
Neutrophils Relative %: 84 % — ABNORMAL HIGH (ref 43–77)
RDW: 13 % (ref 11.5–15.5)
WBC: 15.3 10*3/uL — ABNORMAL HIGH (ref 4.0–10.5)

## 2012-04-08 MED ORDER — ACETAMINOPHEN 325 MG PO TABS
650.0000 mg | ORAL_TABLET | ORAL | Status: DC | PRN
  Administered 2012-04-09 – 2012-04-10 (×4): 650 mg via ORAL
  Filled 2012-04-08 (×5): qty 2

## 2012-04-08 MED ORDER — BISACODYL 10 MG RE SUPP
10.0000 mg | Freq: Once | RECTAL | Status: AC
  Administered 2012-04-08: 10 mg via RECTAL
  Filled 2012-04-08: qty 1

## 2012-04-08 NOTE — Progress Notes (Addendum)
   Subjective: Hospital day 1  Patient reports nausea.No vomiting.  Tolerating regular diet but not much of an appetite.  Pt is 6 days s/p Robotic hysterectomy.  Admitted 04/07/12 with temperature of 103, nausea  Objective: I have reviewed patient's vital signs, intake and output, medications, labs and radiology results. Vital signs are stable, afebrile  General: alert, cooperative and no distress Resp: clear to auscultation bilaterally Cardio: regular rate and rhythm, S1, S2 normal, no murmur, click, rub or gallop GI: incision: clean, dry and intact and BS present , slightly decreased, much better than yesterday. Extremities: extremities normal, atraumatic, no cyanosis or edema   WBC decreased to 15,300 this morning  Assessment: s/p Robotic hysterectomy on 04/02/12 with probable post op pelvic infection.  Improving on antibiotics (Flagyl, Rocephin). HIV, stable. Continue present meds.  Plan: Continue ABX therapy due to Post-op infection  LOS: 1 day    Alyssa Wilson E 04/08/2012, 7:37 AM

## 2012-04-08 NOTE — Progress Notes (Signed)
[facility]   Day #1 antibiotics Day #1 rocephin Day #1 Flagyl  Subjective: Abdominal pain improved  Antibiotics:  Anti-infectives     Start     Dose/Rate Route Frequency Ordered Stop   04/07/12 2200   gentamicin (GARAMYCIN) 100 mg, clindamycin (CLEOCIN) 900 mg in dextrose 5 % 100 mL IVPB  Status:  Discontinued        217 mL/hr over 30 Minutes Intravenous 3 times per day 04/07/12 1337 04/07/12 1431   04/07/12 2200   metroNIDAZOLE (FLAGYL) tablet 500 mg        500 mg Oral 3 times per day 04/07/12 1817     04/07/12 2000   cefTRIAXone (ROCEPHIN) 2 g in dextrose 5 % 50 mL IVPB        2 g 100 mL/hr over 30 Minutes Intravenous Every 24 hours 04/07/12 1814     04/07/12 2000   metroNIDAZOLE (FLAGYL) IVPB 500 mg  Status:  Discontinued        500 mg 100 mL/hr over 60 Minutes Intravenous Every 8 hours 04/07/12 1814 04/07/12 1817   04/07/12 1815  efavirenz-emtricitabine-tenofovir (ATRIPLA) 600-200-300 MG per tablet 1 tablet       1 tablet Oral Daily 04/07/12 1813     04/07/12 1445   cefTRIAXone (ROCEPHIN) 2 g in dextrose 5 % 50 mL IVPB  Status:  Discontinued        2 g 100 mL/hr over 30 Minutes Intravenous Every 24 hours 04/07/12 1433 04/07/12 1748   04/07/12 1445   metroNIDAZOLE (FLAGYL) tablet 500 mg  Status:  Discontinued        500 mg Oral 3 times per day 04/07/12 1434 04/07/12 1748   04/07/12 1400   clindamycin (CLEOCIN) IVPB 900 mg  Status:  Discontinued        900 mg 100 mL/hr over 30 Minutes Intravenous 3 times per day 04/07/12 1325 04/07/12 1335   04/07/12 1400   gentamicin (GARAMYCIN) 130 mg, clindamycin (CLEOCIN) 900 mg in dextrose 5 % 100 mL IVPB  Status:  Discontinued        218.5 mL/hr over 30 Minutes Intravenous  Once 04/07/12 1337 04/07/12 1431          Medications: Scheduled Meds:   . bisacodyl  10 mg Rectal Once  . cefTRIAXone (ROCEPHIN)  IV  2 g Intravenous Q24H  . docusate sodium  100 mg Oral BID  .  efavirenz-emtricitabine-tenofovir  1 tablet Oral Daily  . metroNIDAZOLE  500 mg Oral Q8H  . prenatal multivitamin  1 tablet Oral Daily   Continuous Infusions:   . lactated ringers 125 mL/hr at 04/08/12 0138   PRN Meds:.alum & mag hydroxide-simeth, butorphanol, ondansetron (ZOFRAN) IV, ondansetron, oxyCODONE-acetaminophen, zolpidem   Objective: Weight change:   Intake/Output Summary (Last 24 hours) at 04/08/12 1853 Last data filed at 04/08/12 1400  Gross per 24 hour  Intake 3042.5 ml  Output   2425 ml  Net  617.5 ml   Blood pressure 125/82, pulse 100, temperature 99.5 F (37.5 C), temperature source Oral, resp. rate 18, height 5\' 3"  (1.6 m), weight 182 lb 3.2 oz (82.645 kg), last menstrual period 03/17/2012, SpO2 95.00%. Temp:  [98.3 F (36.8 C)-99.6 F (37.6 C)] 99.5 F (37.5 C) (09/01 1400) Pulse Rate:  [84-117] 100  (09/01 1400) Resp:  [18-20] 18  (09/01 1400) BP: (103-125)/(68-82) 125/82 mmHg (09/01 1400) SpO2:  [95 %-98 %] 95 % (09/01 1400) Weight:  [182 lb 3.2 oz (  82.645 kg)] 182 lb 3.2 oz (82.645 kg) (09/01 0500)  Physical Exam: General: Alert and awake, oriented x3, not in any acute distress.  HEENT: anicteric sclera, pupils reactive to light and accommodation, EOMI, oropharynx clear and without exudate  CVS regular rate, normal r, no murmur rubs or gallops  Chest: clear to auscultation bilaterally, no wheezing, rales or rhonchi  Abdomen:nontender, nondistended, normal bowel sounds,  Extremities: no clubbing or edema noted bilaterally  Skin: surgical sites are clean  Neuro: nonfocal, strength and sensation intact   Lab Results:  Basename 04/08/12 0718 04/07/12 1150  WBC 15.3* 18.9*  HGB 10.3* 12.1  HCT 29.2* 34.5*  PLT 121* 145*    BMET  Basename 04/07/12 1157  NA 135  K 3.7  CL 99  CO2 24  GLUCOSE 123*  BUN 6  CREATININE 0.75  CALCIUM 8.8    Micro Results: Recent Results (from the past 240 hour(s))  WET PREP, GENITAL     Status: Abnormal    Collection Time   04/07/12 12:55 PM      Component Value Range Status Comment   Yeast Wet Prep HPF POC NONE SEEN  NONE SEEN Final    Trich, Wet Prep NONE SEEN  NONE SEEN Final    Clue Cells Wet Prep HPF POC NONE SEEN  NONE SEEN Final    WBC, Wet Prep HPF POC MANY (*) NONE SEEN Final MANY BACTERIA SEEN  CULTURE, BLOOD (ROUTINE X 2)     Status: Normal (Preliminary result)   Collection Time   04/07/12  2:00 PM      Component Value Range Status Comment   Specimen Description BLOOD RIGHT ARM   Final    Special Requests BOTTLES DRAWN AEROBIC AND ANAEROBIC   Final    Culture  Setup Time 04/07/2012 17:56   Final    Culture     Final    Value:        BLOOD CULTURE RECEIVED NO GROWTH TO DATE CULTURE WILL BE HELD FOR 5 DAYS BEFORE ISSUING A FINAL NEGATIVE REPORT   Report Status PENDING   Incomplete   CULTURE, BLOOD (ROUTINE X 2)     Status: Normal (Preliminary result)   Collection Time   04/07/12  2:10 PM      Component Value Range Status Comment   Specimen Description BLOOD RIGHT ARM   Final    Special Requests BOTTLES DRAWN AEROBIC AND ANAEROBIC   Final    Culture  Setup Time 04/07/2012 17:56   Final    Culture     Final    Value:        BLOOD CULTURE RECEIVED NO GROWTH TO DATE CULTURE WILL BE HELD FOR 5 DAYS BEFORE ISSUING A FINAL NEGATIVE REPORT   Report Status PENDING   Incomplete     Studies/Results: US Transvaginal Non-ob  04/07/2012  *RADIOLOGY REPORT*  Clinical Data:  Pelvic pain 5 days post hysterectomy, abnormal appearance of the right ovary on CT  TRANSABDOMINAL AND TRANSVAGINAL ULTRASOUND OF PELVIS DOPPLER ULTRASOUND OF OVARIES  Technique:  Both transabdominal and transvaginal ultrasound examinations of the pelvis were performed. Transabdominal technique was performed for global imaging of the pelvis including uterus, ovaries, adnexal regions, and pelvic cul-de-sac.  It was necessary to proceed with endovaginal exam following the transabdominal exam to visualize the ovaries.   Color and duplex Doppler ultrasound was utilized to evaluate blood flow to the ovaries.  Comparison:  CT pelvis 04/07/2012  Findings:  Uterus:  Surgically absent  Endometrium:  N/A  Right ovary: 4.6 x 3.8 x 2.9 cm.  No focal solid mass lesion. Crescentic hypoechoic collection along the posterior ovary on trans vaginal images, 3.1 x 3.7 x 1.4 cm, question ovarian cyst versus crescentic paraovarian fluid collection.  Internal blood flow present within right ovary on color Doppler imaging.  Left ovary:   2.4 x 1.6 x 1.7 cm.  Small follicles cyst 1.4 cm greatest size.  Internal blood flow present within left ovary on pulse Doppler imaging.  Pulsed Doppler evaluation demonstrates normal low-resistance arterial and venous waveforms in both ovaries.  Trace free pelvic fluid. No other adnexal masses; specifically no hydrosalpinx visualized.  IMPRESSION: Post hysterectomy. Tiny left ovarian cyst. No evidence of ovarian torsion. Crescentic hypoechoic probable fluid collection along the posterior margin of the right ovary,3.7 x 3.1 x 1.4 cm, uncertain if intraovarian or paraovarian in position. Differential diagnosis includes right ovarian cyst, postoperative paraovarian fluid collection such as hematoma or seroma, and abscess. This may require follow-up CT or sonographic imaging to determine significance.   Original Report Authenticated By: Lollie Marrow, M.D.    US Pelvis Complete  04/07/2012  *RADIOLOGY REPORT*  Clinical Data:  Pelvic pain 5 days post hysterectomy, abnormal appearance of the right ovary on CT  TRANSABDOMINAL AND TRANSVAGINAL ULTRASOUND OF PELVIS DOPPLER ULTRASOUND OF OVARIES  Technique:  Both transabdominal and transvaginal ultrasound examinations of the pelvis were performed. Transabdominal technique was performed for global imaging of the pelvis including uterus, ovaries, adnexal regions, and pelvic cul-de-sac.  It was necessary to proceed with endovaginal exam following the transabdominal exam to  visualize the ovaries.  Color and duplex Doppler ultrasound was utilized to evaluate blood flow to the ovaries.  Comparison:  CT pelvis 04/07/2012  Findings:  Uterus:  Surgically absent  Endometrium:  N/A  Right ovary: 4.6 x 3.8 x 2.9 cm.  No focal solid mass lesion. Crescentic hypoechoic collection along the posterior ovary on trans vaginal images, 3.1 x 3.7 x 1.4 cm, question ovarian cyst versus crescentic paraovarian fluid collection.  Internal blood flow present within right ovary on color Doppler imaging.  Left ovary:   2.4 x 1.6 x 1.7 cm.  Small follicles cyst 1.4 cm greatest size.  Internal blood flow present within left ovary on pulse Doppler imaging.  Pulsed Doppler evaluation demonstrates normal low-resistance arterial and venous waveforms in both ovaries.  Trace free pelvic fluid. No other adnexal masses; specifically no hydrosalpinx visualized.  IMPRESSION: Post hysterectomy. Tiny left ovarian cyst. No evidence of ovarian torsion. Crescentic hypoechoic probable fluid collection along the posterior margin of the right ovary,3.7 x 3.1 x 1.4 cm, uncertain if intraovarian or paraovarian in position. Differential diagnosis includes right ovarian cyst, postoperative paraovarian fluid collection such as hematoma or seroma, and abscess. This may require follow-up CT or sonographic imaging to determine significance.   Original Report Authenticated By: Lollie Marrow, M.D.    Ct Abdomen Pelvis W Contrast  04/07/2012  *RADIOLOGY REPORT*  Clinical Data:  Post abdominal hysterectomy, right lower quadrant pain, leukocytosis, question abscess  CT ABDOMEN AND PELVIS WITH CONTRAST  Technique:  Multidetector CT imaging of the abdomen and pelvis was performed following the standard protocol during bolus administration of intravenous contrast. Sagittal and coronal MPR images reconstructed from axial data set.  Contrast: OMNIPAQUE IOHEXOL 300 MG/ML  SOLN Dilute oral contrast.  Comparison: None  Findings: Lung bases  clear. Liver, spleen, pancreas, kidneys, and adrenal glands normal appearance. Stomach decompressed, suboptimally  visualized. Tiny umbilical hernia containing fat. Bladder only partially distended. Uterus surgically absent.  Low free fluid dependently in pelvis with diffuse stranding of pelvic tissue planes. Focal low attenuation collection in the left pelvis 2.4 x 1.9 cm likely represents a small left ovarian cyst. Larger complex collection is identified in right pelvis, 4.0 x 4.8 x 5.1 cm in size. A portion of this is low attenuation with a slightly thick rim, overall measuring 3.0 x 1.8 cm.  Mild bowel wall thickening of the distal sigmoid colon and a small bowel loop in the right pelvis. Retro cecal appendix, normal appearance. Remaining bowel loops normal. No free intraperitoneal air.  IMPRESSION: Postsurgical changes of hysterectomy with a small amount of low attenuation free pelvic fluid and diffuse stranding of pelvic tissue planes. Probable small left ovarian cyst. More complex soft tissue fluid attenuation collection in the right pelvis likely represents an enlarged edematous right ovary. Differential diagnosis would include postoperative changes with ovarian edema, tubo-ovarian abscess, cannot exclude ovarian torsion. The 3 cm low attenuation collection at the lateral margin of the enlarged right ovary may be related to cyst or hydrosalpinx but small abscess collection is not excluded.  Recommend follow-up sonographic assessment to exclude ovarian torsion.  Findings called to Vernona Rieger RN on 04/07/2012 at 1721 hours.   Original Report Authenticated By: Lollie Marrow, M.D.    [place] Art/ven Flow Abd Pelv Doppler  04/07/2012  *RADIOLOGY REPORT*  Clinical Data:  Pelvic pain 5 days post hysterectomy, abnormal appearance of the right ovary on CT  TRANSABDOMINAL AND TRANSVAGINAL ULTRASOUND OF PELVIS DOPPLER ULTRASOUND OF OVARIES  Technique:  Both transabdominal and transvaginal ultrasound examinations of the pelvis  were performed. Transabdominal technique was performed for global imaging of the pelvis including uterus, ovaries, adnexal regions, and pelvic cul-de-sac.  It was necessary to proceed with endovaginal exam following the transabdominal exam to visualize the ovaries.  Color and duplex Doppler ultrasound was utilized to evaluate blood flow to the ovaries.  Comparison:  CT pelvis 04/07/2012  Findings:  Uterus:  Surgically absent  Endometrium:  N/A  Right ovary: 4.6 x 3.8 x 2.9 cm.  No focal solid mass lesion. Crescentic hypoechoic collection along the posterior ovary on trans vaginal images, 3.1 x 3.7 x 1.4 cm, question ovarian cyst versus crescentic paraovarian fluid collection.  Internal blood flow present within right ovary on color Doppler imaging.  Left ovary:   2.4 x 1.6 x 1.7 cm.  Small follicles cyst 1.4 cm greatest size.  Internal blood flow present within left ovary on pulse Doppler imaging.  Pulsed Doppler evaluation demonstrates normal low-resistance arterial and venous waveforms in both ovaries.  Trace free pelvic fluid. No other adnexal masses; specifically no hydrosalpinx visualized.  IMPRESSION: Post hysterectomy. Tiny left ovarian cyst. No evidence of ovarian torsion. Crescentic hypoechoic probable fluid collection along the posterior margin of the right ovary,3.7 x 3.1 x 1.4 cm, uncertain if intraovarian or paraovarian in position. Differential diagnosis includes right ovarian cyst, postoperative paraovarian fluid collection such as hematoma or seroma, and abscess. This may require follow-up CT or sonographic imaging to determine significance.   Original Report Authenticated By: Lollie Marrow, M.D.       Assessment/Plan: Alyssa Wilson is a 41 y.o. female with HIV, recent robotic hysterectomy and now fevers, nausea, vomting and RLQ pain and possible TOA on CT and TV US.   1) Postoperative fevers: Given CT and US findings with above symptoms I have high level of conviction that this  is a TOA and  not simply an ovarian cyst.   --continue rocephin and flagyl --If pt does NOT undergo drainage she will need at least two weeks of therapy with FOLLOWUP CT scan --we could consider change to levaquin 500mg  po and flagyl 500mg  po tid if pt is DC without drainage of what I think is TOA   2) HIV :  Has been perfectly controlled. She states that recent labs wree drawn in Gyn clinic  -continue atripla    LOS: 1 day   Acey Lav 04/08/2012, 6:53 PM

## 2012-04-09 LAB — CBC WITH DIFFERENTIAL/PLATELET
HCT: 28.4 % — ABNORMAL LOW (ref 36.0–46.0)
Hemoglobin: 9.8 g/dL — ABNORMAL LOW (ref 12.0–15.0)
Lymphs Abs: 1.1 10*3/uL (ref 0.7–4.0)
Monocytes Relative: 8 % (ref 3–12)
Neutro Abs: 10.3 10*3/uL — ABNORMAL HIGH (ref 1.7–7.7)
Neutrophils Relative %: 82 % — ABNORMAL HIGH (ref 43–77)
RBC: 3.61 MIL/uL — ABNORMAL LOW (ref 3.87–5.11)

## 2012-04-09 LAB — CULTURE, ROUTINE-GENITAL
Culture: NORMAL
Special Requests: NORMAL

## 2012-04-09 LAB — URINE CULTURE

## 2012-04-09 MED ORDER — SODIUM CHLORIDE 0.9 % IV SOLN
INTRAVENOUS | Status: DC
  Administered 2012-04-09 – 2012-04-11 (×5): via INTRAVENOUS

## 2012-04-09 NOTE — Progress Notes (Signed)
[facility]    Day #2 antibiotics Day #2rocephin Day #2 Flagyl  Subjective: Feeling better  Antibiotics:  Anti-infectives     Start     Dose/Rate Route Frequency Ordered Stop   04/07/12 2200   gentamicin (GARAMYCIN) 100 mg, clindamycin (CLEOCIN) 900 mg in dextrose 5 % 100 mL IVPB  Status:  Discontinued        217 mL/hr over 30 Minutes Intravenous 3 times per day 04/07/12 1337 04/07/12 1431   04/07/12 2200   metroNIDAZOLE (FLAGYL) tablet 500 mg        500 mg Oral 3 times per day 04/07/12 1817     04/07/12 2000   cefTRIAXone (ROCEPHIN) 2 g in dextrose 5 % 50 mL IVPB        2 g 100 mL/hr over 30 Minutes Intravenous Every 24 hours 04/07/12 1814     04/07/12 2000   metroNIDAZOLE (FLAGYL) IVPB 500 mg  Status:  Discontinued        500 mg 100 mL/hr over 60 Minutes Intravenous Every 8 hours 04/07/12 1814 04/07/12 1817   04/07/12 1815  efavirenz-emtricitabine-tenofovir (ATRIPLA) 600-200-300 MG per tablet 1 tablet       1 tablet Oral Daily 04/07/12 1813     04/07/12 1445   cefTRIAXone (ROCEPHIN) 2 g in dextrose 5 % 50 mL IVPB  Status:  Discontinued        2 g 100 mL/hr over 30 Minutes Intravenous Every 24 hours 04/07/12 1433 04/07/12 1748   04/07/12 1445   metroNIDAZOLE (FLAGYL) tablet 500 mg  Status:  Discontinued        500 mg Oral 3 times per day 04/07/12 1434 04/07/12 1748   04/07/12 1400   clindamycin (CLEOCIN) IVPB 900 mg  Status:  Discontinued        900 mg 100 mL/hr over 30 Minutes Intravenous 3 times per day 04/07/12 1325 04/07/12 1335   04/07/12 1400   gentamicin (GARAMYCIN) 130 mg, clindamycin (CLEOCIN) 900 mg in dextrose 5 % 100 mL IVPB  Status:  Discontinued        218.5 mL/hr over 30 Minutes Intravenous  Once 04/07/12 1337 04/07/12 1431          Medications: Scheduled Meds:    . cefTRIAXone (ROCEPHIN)  IV  2 g Intravenous Q24H  . docusate sodium  100 mg Oral BID  . efavirenz-emtricitabine-tenofovir  1 tablet Oral Daily  .  metroNIDAZOLE  500 mg Oral Q8H  . prenatal multivitamin  1 tablet Oral Daily   Continuous Infusions:    . lactated ringers 125 mL/hr at 04/09/12 1737   PRN Meds:.acetaminophen, alum & mag hydroxide-simeth, butorphanol, ondansetron (ZOFRAN) IV, ondansetron, oxyCODONE-acetaminophen, zolpidem   Objective: Weight change: 4 lb 11.4 oz (2.138 kg)  Intake/Output Summary (Last 24 hours) at 04/09/12 1920 Last data filed at 04/09/12 1800  Gross per 24 hour  Intake   4220 ml  Output   2750 ml  Net   1470 ml   Blood pressure 123/77, pulse 89, temperature 98.1 F (36.7 C), temperature source Oral, resp. rate 18, height 5\' 3"  (1.6 m), weight 183 lb 5 oz (83.15 kg), last menstrual period 03/17/2012, SpO2 98.00%. Temp:  [97.8 F (36.6 C)-99.8 F (37.7 C)] 98.1 F (36.7 C) (09/02 1721) Pulse Rate:  [79-95] 89  (09/02 1721) Resp:  [18] 18  (09/02 1721) BP: (115-124)/(77-87) 123/77 mmHg (09/02 1721) SpO2:  [96 %-100 %] 98 % (09/02 1721) Weight:  [409  lb 5 oz (83.15 kg)] 183 lb 5 oz (83.15 kg) (09/02 0516)  Physical Exam: General: Alert and awake, oriented x3, not in any acute distress.  HEENT: anicteric sclera, pupils reactive to light and accommodation, EOMI, oropharynx clear and without exudate  CVS regular rate, normal r, no murmur rubs or gallops  Chest: clear to auscultation bilaterally, no wheezing, rales or rhonchi  Abdomen:nontender, nondistended, normal bowel sounds,  Extremities: no clubbing or edema noted bilaterally  Skin: surgical sites are clean  Neuro: nonfocal, strength and sensation intact   Lab Results:  [facility] 04/09/12 0652 04/08/12 0718  WBC 12.5* 15.3*  HGB 9.8* 10.3*  HCT 28.4* 29.2*  PLT 137* 121*    BMET  Basename 04/07/12 1157  NA 135  K 3.7  CL 99  CO2 24  GLUCOSE 123*  BUN 6  CREATININE 0.75  CALCIUM 8.8    Micro Results: Recent Results (from the past 240 hour(s))  URINE CULTURE     Status: Normal   Collection Time   04/07/12 11:20 AM       Component Value Range Status Comment   Specimen Description URINE, CLEAN CATCH   Final    Special Requests none   Final    Culture  Setup Time 04/07/2012 20:42   Final    Colony Count 25,000 COLONIES/ML   Final    Culture     Final    Value: Multiple bacterial morphotypes present, none predominant. Suggest appropriate recollection if clinically indicated.   Report Status 04/09/2012 FINAL   Final   CULTURE, ROUTINE-GENITAL     Status: Normal   Collection Time   04/07/12 12:55 PM      Component Value Range Status Comment   Specimen Description VAGINAL/RECTAL   Final    Special Requests Normal   Final    Culture NORMAL VAGINAL FLORA   Final    Report Status 04/09/2012 FINAL   Final   WET PREP, GENITAL     Status: Abnormal   Collection Time   04/07/12 12:55 PM      Component Value Range Status Comment   Yeast Wet Prep HPF POC NONE SEEN  NONE SEEN Final    Trich, Wet Prep NONE SEEN  NONE SEEN Final    Clue Cells Wet Prep HPF POC NONE SEEN  NONE SEEN Final    WBC, Wet Prep HPF POC MANY (*) NONE SEEN Final MANY BACTERIA SEEN  CULTURE, BLOOD (ROUTINE X 2)     Status: Normal (Preliminary result)   Collection Time   04/07/12  2:00 PM      Component Value Range Status Comment   Specimen Description BLOOD RIGHT ARM   Final    Special Requests BOTTLES DRAWN AEROBIC AND ANAEROBIC   Final    Culture  Setup Time 04/07/2012 17:56   Final    Culture     Final    Value:        BLOOD CULTURE RECEIVED NO GROWTH TO DATE CULTURE WILL BE HELD FOR 5 DAYS BEFORE ISSUING A FINAL NEGATIVE REPORT   Report Status PENDING   Incomplete   CULTURE, BLOOD (ROUTINE X 2)     Status: Normal (Preliminary result)   Collection Time   04/07/12  2:10 PM      Component Value Range Status Comment   Specimen Description BLOOD RIGHT ARM   Final    Special Requests BOTTLES DRAWN AEROBIC AND ANAEROBIC   Final    Culture  Setup Time 04/07/2012 17:56   Final    Culture     Final    Value:        BLOOD CULTURE  RECEIVED NO GROWTH TO DATE CULTURE WILL BE HELD FOR 5 DAYS BEFORE ISSUING A FINAL NEGATIVE REPORT   Report Status PENDING   Incomplete     Studies/Results: No results found.    Assessment/Plan: Alyssa Wilson is a 41 y.o. female with HIV, recent robotic hysterectomy and now fevers, nausea, vomting and RLQ pain and possible TOA on CT and TV US. Dr. Neva Seat plans on repeating TV US and CT scan in the am  1) Postoperative fevers: Given CT and US findings with above symptoms I have high level of conviction that this is a TOA and not simply an ovarian cyst.   --I am curious if TOA would be expected to improve sufficiently in 48 hours on CT and TV US. If no drainage is to be pursued would not a  CT and TV US be more likely to show meaningful improvements,resololution vs  worsening or stability? I would pose this ? To [facility] since it could spare pt one CT scan's worth of radiation  --otherwise would continue rocephin and flagyl    2) HIV :  Has been perfectly controlled. She states that recent labs wree drawn in Gyn clinic  -continue atripla  Dr. Orvan Falconer is here tomorrow.   LOS: 2 days   Acey Lav 04/09/2012, 7:20 PM

## 2012-04-09 NOTE — Progress Notes (Addendum)
   Subjective: Patient says she feels much better today.  Patient reports + flatus, + BM and no problems voiding Vomited after taking flagyl.  Has started taking with food with no further complaints.    Objective: I have reviewed patient's vital signs, intake and output, medications and labs. T max 99.8  General: alert, cooperative and no distress Resp: clear to auscultation bilaterally Cardio: regular rate and rhythm, S1, S2 normal, no murmur, click, rub or gallop GI: soft, non-tender; bowel sounds normal; no masses,  no organomegaly and incision: clean, dry and intact Extremities: extremities normal, atraumatic, no cyanosis or edema Vaginal Bleeding: none  Blood cultures negative so far. Clean catch urine culture mixed, probable normal flora Vaginal culture - normal vaginal flora  Assessment: s/p : progressing well  Plan: Continue ABX therapy due to Post-op infection Consider repeat US or CT tomorrow morning.  LOS: 2 days    Alyssa Wilson E 04/09/2012, 12:08 PM

## 2012-04-09 NOTE — Progress Notes (Signed)
Ur chart review completed.  

## 2012-04-10 ENCOUNTER — Inpatient Hospital Stay (HOSPITAL_COMMUNITY): Payer: Managed Care, Other (non HMO)

## 2012-04-10 LAB — CBC
HCT: 27.3 % — ABNORMAL LOW (ref 36.0–46.0)
Hemoglobin: 9.3 g/dL — ABNORMAL LOW (ref 12.0–15.0)
MCH: 26.8 pg (ref 26.0–34.0)
MCV: 78.7 fL (ref 78.0–100.0)
RBC: 3.47 MIL/uL — ABNORMAL LOW (ref 3.87–5.11)

## 2012-04-10 LAB — GC/CHLAMYDIA PROBE AMP, GENITAL: GC Probe Amp, Genital: NEGATIVE

## 2012-04-10 NOTE — Progress Notes (Signed)
Subjective: Patient reports tolerating PO, + flatus, + BM and no problems voiding.   Pt states the right lower quadrant pain that she experienced on admission has resolved. She feels " much better"  Objective: I have reviewed patient's vital signs, intake and output, medications, labs and radiology results.  General: alert and cooperative GI: soft, non-tender; bowel sounds normal; no masses,  no organomegaly Extremities: extremities normal, atraumatic, no cyanosis or edema   Assessment/Plan: POD # 7 s/p robotic assisted laparoscopic hysterectomy admitted with postoperative fever. Most likely due to [facility] Plan to continue IV antibiotics today. Repeat U/S for interval evaluation of Right adnexa. If stable and not worsening. Plan to d/c home on PO antibiotics tomorrow.  HIV  Followed by ID.Marland Kitchen Appreciate ID consult.    LOS: 3 days    Cha Gomillion J. 04/10/2012, 8:01 AM

## 2012-04-10 NOTE — Progress Notes (Addendum)
Patient ID: Alyssa Wilson, female   DOB: [birth year], 41 y.o.   MRN: [id number]    [facility]    Date of Admission:  04/07/2012           Day 4 ceftriaxone         Day 4 metronidazole    . cefTRIAXone (ROCEPHIN)  IV  2 g Intravenous Q24H  . docusate sodium  100 mg Oral BID  . efavirenz-emtricitabine-tenofovir  1 tablet Oral Daily  . metroNIDAZOLE  500 mg Oral Q8H  . prenatal multivitamin  1 tablet Oral Daily    Subjective: Alyssa Wilson is feeling much better. She's not having any further nausea, vomiting or abdominal pain.  Objective: Temp:  [97.7 F (36.5 C)-98.4 F (36.9 C)] 97.7 F (36.5 C) (09/03 1000) Pulse Rate:  [73-89] 80  (09/03 1000) Resp:  [18] 18  (09/03 1000) BP: (111-124)/(71-87) 116/85 mmHg (09/03 1000) SpO2:  [98 %-100 %] 100 % (09/03 1000) Weight:  [82.101 kg (181 lb)] 82.101 kg (181 lb) (09/03 0549)  General: She is smiling and joking Skin: No rash Lungs: Clear Cor: Regular S1 and S2 and no murmur Abdomen: Soft and nontender. Quiet bowel sounds.  Lab Results  HIV 1 RNA Quant (copies/mL)  Date Value  09/19/2011 <20   06/21/2011 NOT DETECTED   12/29/2010 <20      CD4 T Cell Abs (cmm)  Date Value  09/20/2011 590   06/21/2011 460   12/29/2010 490    he Lab Results  Component Value Date   WBC 9.0 04/10/2012   HGB 9.3* 04/10/2012   HCT 27.3* 04/10/2012   MCV 78.7 04/10/2012   PLT 162 04/10/2012    Lab Results  Component Value Date   CREATININE 0.75 04/07/2012   BUN 6 04/07/2012   NA 135 04/07/2012   K 3.7 04/07/2012   CL 99 04/07/2012   CO2 24 04/07/2012    Lab Results  Component Value Date   ALT 13 04/07/2012   AST 14 04/07/2012   ALKPHOS 113 04/07/2012   BILITOT 0.5 04/07/2012      Microbiology: Recent Results (from the past 240 hour(s))  URINE CULTURE     Status: Normal   Collection Time   04/07/12 11:20 AM      Component Value Range Status Comment   Specimen Description URINE, CLEAN CATCH   Final    Special Requests none    Final    Culture  Setup Time 04/07/2012 20:42   Final    Colony Count 25,000 COLONIES/ML   Final    Culture     Final    Value: Multiple bacterial morphotypes present, none predominant. Suggest appropriate recollection if clinically indicated.   Report Status 04/09/2012 FINAL   Final   CULTURE, ROUTINE-GENITAL     Status: Normal   Collection Time   04/07/12 12:55 PM      Component Value Range Status Comment   Specimen Description VAGINAL/RECTAL   Final    Special Requests Normal   Final    Culture NORMAL VAGINAL FLORA   Final    Report Status 04/09/2012 FINAL   Final   WET PREP, GENITAL     Status: Abnormal   Collection Time   04/07/12 12:55 PM      Component Value Range Status Comment   Yeast Wet Prep HPF POC NONE SEEN  NONE SEEN Final    Trich, Wet Prep NONE SEEN  NONE SEEN Final  Clue Cells Wet Prep HPF POC NONE SEEN  NONE SEEN Final    WBC, Wet Prep HPF POC MANY (*) NONE SEEN Final MANY BACTERIA SEEN  CULTURE, BLOOD (ROUTINE X 2)     Status: Normal (Preliminary result)   Collection Time   04/07/12  2:00 PM      Component Value Range Status Comment   Specimen Description BLOOD RIGHT ARM   Final    Special Requests BOTTLES DRAWN AEROBIC AND ANAEROBIC   Final    Culture  Setup Time 04/07/2012 17:56   Final    Culture     Final    Value:        BLOOD CULTURE RECEIVED NO GROWTH TO DATE CULTURE WILL BE HELD FOR 5 DAYS BEFORE ISSUING A FINAL NEGATIVE REPORT   Report Status PENDING   Incomplete   CULTURE, BLOOD (ROUTINE X 2)     Status: Normal (Preliminary result)   Collection Time   04/07/12  2:10 PM      Component Value Range Status Comment   Specimen Description BLOOD RIGHT ARM   Final    Special Requests BOTTLES DRAWN AEROBIC AND ANAEROBIC   Final    Culture  Setup Time 04/07/2012 17:56   Final    Culture     Final    Value:        BLOOD CULTURE RECEIVED NO GROWTH TO DATE CULTURE WILL BE HELD FOR 5 DAYS BEFORE ISSUING A FINAL NEGATIVE REPORT   Report Status PENDING    Incomplete     Studies/Results: US Transvaginal Non-ob  04/10/2012  *RADIOLOGY REPORT*  Clinical Data: Postop from laparoscopic hysterectomy 1 week ago. Pelvic pain.  Follow-up right adnexal fluid collection.  TRANSVAGINAL ULTRASOUND OF PELVIS  Technique:  Transvaginal ultrasound examination of the pelvis was performed including evaluation of the uterus, ovaries, adnexal regions, and pelvic cul-de-sac.  Comparison:  04/07/2012  Findings:  Uterus:  Surgically absent.  Vaginal cuff is unremarkable in appearance.  Right ovary: 3.7 x 2.7 x 4.8 cm.  Normal appearance/no adnexal mass  Left ovary: 2.4 x 1.9 x 2.3 cm.  Normal appearance/no adnexal mass  Other Findings:  A small amount of simple free fluid now seen in cul-de-sac.  IMPRESSION:  1.  Persistent small amount of free fluid now seen in the pelvic cul-de-sac.  No definite abscess or drainable fluid collection identified. 2.  Normal appearance of both ovaries. 3.  Prior hysterectomy.   Original Report Authenticated By: Danae Orleans, M.D.     Assessment: Alyssa Wilson responded very promptly to empiric antibiotic therapy for probable postoperative right tubo-ovarian abscess. Her repeat ultrasound appears normal now. I would agree with conversion to oral Augmentin tomorrow and treat for 10 more days.  Plan: 1. Recommend switch to Augmentin 875 mg by mouth twice daily and treat for 10 more days 2. She will schedule a followup appointment with me 3. Please call if I can be of further assistance while here  Cliffton Asters, MD [facility] for Infectious Disease [facility] [phone] pager   [phone] cell 04/10/2012, 1:21 PM

## 2012-04-11 MED ORDER — CEPHALEXIN 500 MG PO CAPS: 500.0000 mg | ORAL_CAPSULE | Freq: Three times a day (TID) | ORAL | Status: AC

## 2012-04-11 MED ORDER — METRONIDAZOLE 500 MG PO TABS: 500.0000 mg | ORAL_TABLET | Freq: Three times a day (TID) | ORAL | Status: AC

## 2012-04-11 NOTE — Progress Notes (Signed)
Patient given discharge instructions, questions answered. 2 prescriptions called in to [facility] at [address] for patient. Atripla(home med) returned to patient from hospital pharmacy.

## 2012-04-11 NOTE — Progress Notes (Signed)
Subjective: Patient reports no problems voiding.   Her pelvic pain has completely resolved... She states that she has had numerous loose stools yesterday. She has one loose stool today. She denies abdominal or pelvic pain. She is on Day 5 of ceftriaxone and Day 5 of metronidazole for presumed Right Tubo ovarian Abscess.   Objective: I have reviewed patient's vital signs, intake and output, medications and labs.  General: alert and cooperative GI: soft, non-tender; bowel sounds normal; no masses,  no organomegaly Extremities: extremities normal, atraumatic, no cyanosis or edema   Assessment/Plan: HD #5 admittted with postoperative fever thought to be due to possible Right Tubo ovarian Abscess. Repeat u/s yesterday did not show any sign of tubo ovarian abscess... Will d/c IV antibiotics. .. Plan to d/c home on Keflex 500 mg TID and Flagyl 500 mg TID .Marland KitchenMarland Kitchen The antibiotic regimen was Discussed with Dr. Orvan Falconer with ID and he is in agreement given that she reports diarrhea.  .. Will culture stool prior to discharge for C difficile.   follow up in the office at post op visit on 04/23/2012   LOS: 4 days    Alyssa Wilson J. 04/11/2012, 11:45 AM

## 2012-04-11 NOTE — Discharge Summary (Signed)
Physician Discharge Summary  Patient ID: Alyssa Wilson MRN: [id number] DOB/AGE: [birth year] 41 y.o.  Admit date: 04/07/2012 Discharge date: 04/11/2012  Admission Diagnoses:1 Postoperative fever 2 HIV    Discharge Diagnoses: Same plus Right tuboovarian ABscess  Active Problems:  * No active hospital problems. *    Discharged Condition: stable  Hospital Course: Patient was admitted on 04/07/2012 with postoperative fever of 103. She underwent a robotic  Assisted laparoscopic hysterectomy on 04/02/2012. She was noted to have possible right tuboovarian abscess on U/S. She received  Rocephin IV and flagyl po with improvement in her pain and resolution of her fever. She developed diarrhea  During admission and culture for c. Diffficile was collected prior to discharge. Results are pending.   Consults: ID  Significant Diagnostic Studies: radiology: Ultrasound: 04/07/2012 right tubo ovarian fluid collection. On 04/10/2012 right and left ovary appeared normal without evidence of abscess   Treatments: antibiotics: ceftriaxone and metronidazole  Discharge Exam: Blood pressure 119/90, pulse 73, temperature 98.3 F (36.8 C), temperature source Oral, resp. rate 18, height 5\' 3"  (1.6 m), weight 83.207 kg (183 lb 7 oz), last menstrual period 03/17/2012, SpO2 99.00%. General appearance: alert and cooperative GI: soft, non-tender; bowel sounds normal; no masses,  no organomegaly Extremities: extremities normal, atraumatic, no cyanosis or edema  Disposition: 01-Home or Self Care  Discharge Orders    Future Orders Please Complete By Expires   Diet - low sodium heart healthy      Increase activity slowly      Sexual Activity Restrictions      Comments:   Avoid sex for 6-8 wks   Lifting restrictions      Comments:   Do not lift over 10 lbs   Call MD for:  temperature >100.4      Call MD for:  persistant nausea and vomiting      Call MD for:  severe uncontrolled pain        Medication List  As of  04/11/2012 12:01 PM   TAKE these medications         cephALEXin 500 MG capsule   Commonly known as: KEFLEX   Take 1 capsule (500 mg total) by mouth 3 (three) times daily.      diclofenac 75 MG EC tablet   Commonly known as: VOLTAREN   Take 75 mg by mouth 2 (two) times daily.      efavirenz-emtricitabine-tenofovir 600-200-300 MG per tablet   Commonly known as: ATRIPLA   Take 1 tablet by mouth daily.      Glucosamine Sulfate 1000 MG Tabs   Take 2 tablets by mouth daily.      ibuprofen 600 MG tablet   Commonly known as: ADVIL,MOTRIN   Take 1 tablet (600 mg total) by mouth every 6 (six) hours as needed (mild pain).      metroNIDAZOLE 500 MG tablet   Commonly known as: FLAGYL   Take 1 tablet (500 mg total) by mouth every 8 (eight) hours.      multivitamin tablet   Take 1 tablet by mouth daily.      OVER THE COUNTER MEDICATION   Take 1 tablet by mouth as needed. Over the counter stool softener for constipation      oxyCODONE-acetaminophen 5-325 MG per tablet   Commonly known as: PERCOCET/ROXICET   Take 1-2 tablets by mouth every 6 (six) hours as needed (moderate to severe pain (when tolerating fluids)).      traMADol 50 MG tablet   Commonly  known as: ULTRAM   Take 50 mg by mouth every 6 (six) hours as needed.           Follow-up Information    Follow up with Jessee Avers., MD. Schedule an appointment as soon as possible for a visit on 04/23/2012. (pt has an appt for 04/23/2012)    Contact information:   [address] [place] [address] [phone]          Signed: Jessee Avers. 04/11/2012, 12:01 PM

## 2012-04-12 NOTE — Progress Notes (Signed)
Post discharge UR review completed. 

## 2012-04-13 LAB — CULTURE, BLOOD (ROUTINE X 2)
Culture: NO GROWTH
Culture: NO GROWTH

## 2012-05-22 ENCOUNTER — Other Ambulatory Visit: Payer: Managed Care, Other (non HMO)

## 2012-05-22 DIAGNOSIS — B20 Human immunodeficiency virus [HIV] disease: Secondary | ICD-10-CM

## 2012-05-23 LAB — HIV-1 RNA QUANT-NO REFLEX-BLD
HIV 1 RNA Quant: <20 {copies}/mL
HIV-1 RNA Quant, Log: <1.3 {Log}

## 2012-05-24 LAB — T-HELPER CELL (CD4) - (RCID CLINIC ONLY)
CD4 % Helper T Cell: 35 % (ref 33–55)
CD4 T Cell Abs: 680 uL (ref 400–2700)

## 2012-06-06 ENCOUNTER — Ambulatory Visit (INDEPENDENT_AMBULATORY_CARE_PROVIDER_SITE_OTHER): Payer: Managed Care, Other (non HMO) | Admitting: Internal Medicine

## 2012-06-06 VITALS — BP 124/88 | HR 73 | Temp 97.7°F | Ht 63.0 in | Wt 181.5 lb

## 2012-06-06 DIAGNOSIS — Z23 Encounter for immunization: Secondary | ICD-10-CM

## 2012-06-06 DIAGNOSIS — B20 Human immunodeficiency virus [HIV] disease: Secondary | ICD-10-CM

## 2012-06-06 NOTE — Progress Notes (Signed)
    [facility]  Patient Active Problem List  Diagnosis  . HIV INFECTION  . HUMAN PAPILLOMAVIRUS  . ANXIETY  . DEPRESSION  . ALLERGIC RHINITIS  . TUBAL PREGNANCY  . 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   GLUCOSAMINE SULFATE 1000 MG TABS    Take 2 tablets by mouth daily.  Modified Medications   No medications on file  Discontinued Medications   DICLOFENAC (VOLTAREN) 75 MG EC TABLET    Take 75 mg by mouth 2 (two) times daily.   MULTIPLE VITAMIN (MULTIVITAMIN) TABLET    Take 1 tablet by mouth daily.     OVER THE COUNTER MEDICATION    Take 1 tablet by mouth as needed. Over the counter stool softener for constipation   TRAMADOL (ULTRAM) 50 MG TABLET    Take 50 mg by mouth every 6 (six) hours as needed.    Subjective: Alyssa Wilson is in for her routine visit. She was recently hospitalized with a probable right tubo-ovarian abscess. She improved with empiric antibiotic therapy but continued to have some vaginal bleeding up until one week ago. Currently she is feeling well. She does want to lose weight and is planning on meeting with her primary care physician soon to discuss the fact that she was told that she was borderline diabetic and a medical screening fair at work.  Objective: Temp: 97.7 F (36.5 C) (10/30 1346) Temp src: Oral (10/30 1346) BP: 124/88 mmHg (10/30 1346) Pulse Rate: 73  (10/30 1346)  General: She is in good spirits. Her weight is up in her body mass index is just over 32. Skin: No rash Lungs: Clear Cor: Regular S1 and S2 and no murmurs  Lab Results HIV 1 RNA Quant (copies/mL)  Date Value  05/22/2012 <20   09/19/2011 <20   06/21/2011 NOT DETECTED      CD4 T Cell Abs (cmm)  Date Value  05/22/2012 680   09/20/2011 590     06/21/2011 460    Lab Results  Component Value Date   CHOL 206* 09/19/2011   HDL 60 09/19/2011   LDLCALC 129* 09/19/2011   TRIG 84 09/19/2011   CHOLHDL 3.4 09/19/2011      Assessment: Her HIV infection remains under excellent control. I will continue Atripla.  She is aware that metabolic syndrome is much more likely to cause her harm in the coming years and HIV infection. She seems motivated work on this and plans to discuss lifestyle modification with her primary care physician at her upcoming visit.  Plan: 1. Continue Atripla 1. Lifestyle modification 2. Followup here after lab work in 6 months   Cliffton Asters, MD [facility] for Infectious Disease [facility] [phone] pager   [phone] cell 06/06/2012, 2:03 PM

## 2012-10-15 ENCOUNTER — Other Ambulatory Visit: Payer: Self-pay | Admitting: *Deleted

## 2012-10-15 DIAGNOSIS — B20 Human immunodeficiency virus [HIV] disease: Secondary | ICD-10-CM

## 2012-10-15 MED ORDER — EFAVIRENZ-EMTRICITAB-TENOFOVIR 600-200-300 MG PO TABS: 1.0000 | ORAL_TABLET | Freq: Every day | ORAL | Status: DC

## 2012-11-06 ENCOUNTER — Other Ambulatory Visit: Payer: Self-pay | Admitting: Obstetrics and Gynecology

## 2012-11-06 ENCOUNTER — Other Ambulatory Visit (HOSPITAL_COMMUNITY)
Admission: RE | Admit: 2012-11-06 | Discharge: 2012-11-06 | Disposition: A | Discharge status: Home / Self Care | Payer: Managed Care, Other (non HMO) | Source: Ambulatory Visit | Attending: Obstetrics and Gynecology | Admitting: Obstetrics and Gynecology

## 2012-11-06 DIAGNOSIS — Z01419 Encounter for gynecological examination (general) (routine) without abnormal findings: Secondary | ICD-10-CM | POA: Insufficient documentation

## 2012-11-06 DIAGNOSIS — Z1151 Encounter for screening for human papillomavirus (HPV): Secondary | ICD-10-CM | POA: Insufficient documentation

## 2012-11-26 ENCOUNTER — Other Ambulatory Visit: Payer: Self-pay

## 2012-11-26 DIAGNOSIS — Z1231 Encounter for screening mammogram for malignant neoplasm of breast: Secondary | ICD-10-CM

## 2012-12-19 ENCOUNTER — Ambulatory Visit: Payer: Managed Care, Other (non HMO)

## 2013-01-02 ENCOUNTER — Other Ambulatory Visit: Payer: Managed Care, Other (non HMO)

## 2013-01-02 ENCOUNTER — Other Ambulatory Visit: Payer: Self-pay | Admitting: Infectious Disease

## 2013-01-02 DIAGNOSIS — B20 Human immunodeficiency virus [HIV] disease: Secondary | ICD-10-CM

## 2013-01-02 LAB — COMPREHENSIVE METABOLIC PANEL
ALT: 16 U/L (ref 0–35)
AST: 16 U/L (ref 0–37)
Calcium: 8.7 mg/dL (ref 8.4–10.5)
Chloride: 107 mEq/L (ref 96–112)
Creat: 0.81 mg/dL (ref 0.50–1.10)

## 2013-01-02 LAB — CBC
MCV: 77.4 fL — ABNORMAL LOW (ref 78.0–100.0)
Platelets: 198 10*3/uL (ref 150–400)
RDW: 14 % (ref 11.5–15.5)
WBC: 7.6 10*3/uL (ref 4.0–10.5)

## 2013-01-02 LAB — LIPID PANEL
Total CHOL/HDL Ratio: 3.4 Ratio
VLDL: 17 mg/dL (ref 0–40)

## 2013-01-02 NOTE — Addendum Note (Signed)
Addended bySteva Colder on: 01/02/2013 04:40 PM   Modules accepted: Orders

## 2013-01-04 LAB — HIV-1 RNA QUANT-NO REFLEX-BLD
HIV 1 RNA Quant: <20 copies/mL (ref <20)
HIV-1 RNA Quant, Log: <1.3 {Log} (ref <1.30)

## 2013-01-04 LAB — T-HELPER CELL (CD4) - (RCID CLINIC ONLY)
CD4 % Helper T Cell: 38 % (ref 33–55)
CD4 T Cell Abs: 700 uL (ref 400–2700)

## 2013-01-16 ENCOUNTER — Other Ambulatory Visit: Payer: Self-pay | Admitting: Licensed Clinical Social Worker

## 2013-01-16 ENCOUNTER — Encounter: Payer: Self-pay | Admitting: Internal Medicine

## 2013-01-16 ENCOUNTER — Ambulatory Visit (INDEPENDENT_AMBULATORY_CARE_PROVIDER_SITE_OTHER): Payer: Managed Care, Other (non HMO) | Admitting: Internal Medicine

## 2013-01-16 VITALS — BP 129/83 | HR 89 | Temp 98.1°F | Ht 62.5 in | Wt 181.5 lb

## 2013-01-16 DIAGNOSIS — B20 Human immunodeficiency virus [HIV] disease: Secondary | ICD-10-CM

## 2013-01-16 DIAGNOSIS — Z21 Asymptomatic human immunodeficiency virus [HIV] infection status: Secondary | ICD-10-CM

## 2013-01-16 MED ORDER — EFAVIRENZ-EMTRICITAB-TENOFOVIR 600-200-300 MG PO TABS: 1.0000 | ORAL_TABLET | Freq: Every day | ORAL | Status: DC

## 2013-01-16 NOTE — Progress Notes (Signed)
Patient ID: Alyssa Wilson, female   DOB: 1971/07/30, 42 y.o.   MRN: [id number]          [facility]  Patient Active Problem List   Diagnosis Date Noted  . ANXIETY 06/24/2010  . ALLERGIC RHINITIS 02/02/2010  . 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 DEPRESSION 02/05/2008  . TUBAL PREGNANCY 02/05/2008  . 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 HIV INFECTION 01/22/2008    Patient's Medications  New Prescriptions   No medications on file  Previous Medications   VITAMIN D, ERGOCALCIFEROL, (DRISDOL) 50000 UNITS CAPS    Take 50,000 Units by mouth every 7 (seven) days.  Modified Medications   Modified Medication Previous Medication   EFAVIRENZ-EMTRICITABINE-TENOFOVIR (ATRIPLA) 600-200-300 MG PER TABLET efavirenz-emtricitabine-tenofovir (ATRIPLA) 600-200-300 MG per tablet      Take 1 tablet by mouth daily.    Take 1 tablet by mouth daily.  Discontinued Medications   GLUCOSAMINE SULFATE 1000 MG TABS    Take 2 tablets by mouth daily.    Subjective: Alyssa Wilson is in for her routine visit. She denies missing any doses of her Atripla. She has recently started using the classes 3 times weekly and is hoping to lose weight. She denies any problems or complaints at this time.  Review of Systems: Pertinent items are noted in HPI.  Past Medical History  Diagnosis Date  . HIV (human immunodeficiency virus infection)   . Headache(784.0)   . Anemia     History  Substance Use Topics  . Smoking status: Never Smoker   . Smokeless tobacco: Never Used  . Alcohol Use: No    No family history on file.  No Known Allergies  Objective: Temp: 98.1 F (36.7 C) (06/11 1547) Temp src: Oral (06/11 1547) BP: 129/83 mmHg (06/11 1547) Pulse Rate: 89 (06/11 1547)  General: She is in good spirits as usual Oral: No oropharyngeal lesions Skin: No rash Lungs: Clear Cor: Regular S1 and S2 no murmurs Abdomen: Soft and nontender Mood and affect normal  Lab  Results HIV 1 RNA Quant (copies/mL)  Date Value  01/02/2013 <20   05/22/2012 <20   09/19/2011 <20      CD4 T Cell Abs (cmm)  Date Value  01/02/2013 700   05/22/2012 680   09/20/2011 590      Lab Results  Component Value Date   WBC 7.6 01/02/2013   HGB 12.3 01/02/2013   HCT 35.6* 01/02/2013   MCV 77.4* 01/02/2013   PLT 198 01/02/2013   BMET    Component Value Date/Time   NA 140 01/02/2013 1640   K 4.2 01/02/2013 1640   CL 107 01/02/2013 1640   CO2 25 01/02/2013 1640   GLUCOSE 94 01/02/2013 1640   BUN 13 01/02/2013 1640   CREATININE 0.81 01/02/2013 1640   CREATININE 0.75 04/07/2012 1157   CALCIUM 8.7 01/02/2013 1640   GFRNONAA >90 04/07/2012 1157   GFRAA >90 04/07/2012 1157   Lab Results  Component Value Date   ALT 16 01/02/2013   AST 16 01/02/2013   ALKPHOS 124* 01/02/2013   BILITOT 0.2* 01/02/2013   Lab Results  Component Value Date   CHOL 207* 01/02/2013   HDL 61 01/02/2013   LDLCALC 129* 01/02/2013   TRIG 83 01/02/2013   CHOLHDL 3.4 01/02/2013   Assessment: Her HIV infection remains under excellent control. She is working on lifestyle modification for weight control. She seems to recall being told about some abnormalities in her  lipid panel by her primary care physician, Dr. Jillyn Hidden.  Plan: 1. Continue Atripla 2. Lifestyle modification 3. I will forward lipid results to Dr. Jillyn Hidden 4. Followup here after lab work in 6 months   Cliffton Asters, MD [facility] for Infectious Disease [facility] [phone] pager   [phone] cell 01/16/2013, 5:23 PM

## 2013-01-17 MED ORDER — EFAVIRENZ-EMTRICITAB-TENOFOVIR 600-200-300 MG PO TABS: 1.0000 | ORAL_TABLET | Freq: Every day | ORAL | Status: DC

## 2013-07-06 ENCOUNTER — Other Ambulatory Visit: Payer: Self-pay | Admitting: Internal Medicine

## 2013-07-09 ENCOUNTER — Other Ambulatory Visit: Payer: Managed Care, Other (non HMO)

## 2013-07-09 DIAGNOSIS — B20 Human immunodeficiency virus [HIV] disease: Secondary | ICD-10-CM

## 2013-07-10 LAB — T-HELPER CELL (CD4) - (RCID CLINIC ONLY)
CD4 % Helper T Cell: 37 % (ref 33–55)
CD4 T Cell Abs: 780 /uL (ref 400–2700)

## 2013-07-22 DIAGNOSIS — E669 Obesity, unspecified: Secondary | ICD-10-CM | POA: Insufficient documentation

## 2013-07-22 DIAGNOSIS — E785 Hyperlipidemia, unspecified: Secondary | ICD-10-CM | POA: Insufficient documentation

## 2013-07-23 ENCOUNTER — Encounter: Payer: Self-pay | Admitting: Internal Medicine

## 2013-07-23 ENCOUNTER — Ambulatory Visit (INDEPENDENT_AMBULATORY_CARE_PROVIDER_SITE_OTHER): Payer: Managed Care, Other (non HMO) | Admitting: Internal Medicine

## 2013-07-23 VITALS — BP 116/82 | HR 70 | Temp 97.8°F | Ht 62.0 in | Wt 184.5 lb

## 2013-07-23 DIAGNOSIS — E669 Obesity, unspecified: Secondary | ICD-10-CM

## 2013-07-23 DIAGNOSIS — Z8701 Personal history of pneumonia (recurrent): Secondary | ICD-10-CM

## 2013-07-23 DIAGNOSIS — B20 Human immunodeficiency virus [HIV] disease: Secondary | ICD-10-CM

## 2013-07-23 DIAGNOSIS — Z23 Encounter for immunization: Secondary | ICD-10-CM

## 2013-07-23 DIAGNOSIS — Z789 Other specified health status: Secondary | ICD-10-CM

## 2013-07-23 DIAGNOSIS — E785 Hyperlipidemia, unspecified: Secondary | ICD-10-CM

## 2013-07-23 NOTE — Progress Notes (Signed)
Patient ID: Alyssa Wilson, female   DOB: [birth year], 42 y.o.   MRN: [id number]          [facility] for Infectious Disease  Patient Active Problem List   Diagnosis Date Noted  . HIV INFECTION 01/22/2008    Priority: High  . Dyslipidemia 07/22/2013    Priority: Medium  . Obesity 07/22/2013    Priority: Medium  . ANXIETY 06/24/2010  . ALLERGIC RHINITIS 02/02/2010  . 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 DEPRESSION 02/05/2008  . TUBAL PREGNANCY 02/05/2008  . 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   EFAVIRENZ-EMTRICITABINE-TENOFOVIR (ATRIPLA) 600-200-300 MG PER TABLET    Take 1 tablet by mouth daily.  Modified Medications   No medications on file  Discontinued Medications   ATRIPLA 600-200-300 MG PER TABLET    Take 1 tablet by mouth  daily   VITAMIN D, ERGOCALCIFEROL, (DRISDOL) 50000 UNITS CAPS    Take 50,000 Units by mouth every 7 (seven) days.    Subjective: Alyssa Wilson is in for her routine visit. As usual she never misses a single dose of her Atripla. She is concerned about her weight gain over the past 4 years and has been trying to lose weight. She has started exercising routinely. She tries to walk about 15 minutes daily at work and also has a workout video that she does for about 20 minutes on most mornings. Her husband was injured on the job a year ago and had to have surgery on his right quadriceps tendon. He is now disabled and unable to work and exercise regularly which have had some impact on her activity. She has been trying to eat healthier food.  She's been bothered by intermittent headaches at work which she believes are related to stress and tension. She notes improvement if she takes one Excedrin Migraine tablet. She's  also noted some improvement if she wears her glasses on more regular basis at work.  Review of Systems: Constitutional: negative Eyes: negative Ears, nose, mouth, throat, and face: negative Respiratory: negative Cardiovascular: negative Gastrointestinal: positive for occasional dyspepsia, negative for abdominal pain, change in bowel habits, constipation, diarrhea, nausea and vomiting Genitourinary:negative  Past Medical History  Diagnosis Date  . HIV (human immunodeficiency virus infection)   . Headache(784.0)   . Anemia     History  Substance Use Topics  . Smoking status: Never Smoker   . Smokeless tobacco: Never Used  . Alcohol Use: No    No family history on file.  No Known Allergies  Objective: Temp: 97.8 F (36.6 C) (12/16 1052) Temp src: Oral (12/16 1052) BP: 116/82 mmHg (12/16 1052) Pulse Rate: 70 (12/16 1052)  General: Her weight is stable over the past year at 183 pounds. She's gained about 35 pounds in the last 4 years Oral: No oropharyngeal lesions and teeth in good condition Skin: No rash Lungs: Clear Cor: Regular S1 and S2 with no murmurs Abdomen: Soft and nontender Joints and extremities: Normal Neuro: Alert and fully oriented with normal speech and conversation Mood and affect: Appropriate and bright  Lab Results Lab Results  Component Value Date   WBC 7.6 01/02/2013   HGB 12.3 01/02/2013   HCT 35.6* 01/02/2013   MCV 77.4* 01/02/2013   PLT 198 01/02/2013  Lab Results  Component Value Date   CREATININE 0.81 01/02/2013   BUN 13 01/02/2013   NA 140 01/02/2013   K 4.2 01/02/2013   CL 107 01/02/2013   CO2 25 01/02/2013    Lab Results  Component Value Date   ALT 16 01/02/2013   AST 16 01/02/2013   ALKPHOS 124* 01/02/2013   BILITOT 0.2* 01/02/2013    Lab Results  Component Value Date   CHOL 207* 01/02/2013   HDL 61 01/02/2013   LDLCALC 129* 01/02/2013   TRIG 83 01/02/2013   CHOLHDL 3.4 01/02/2013    Lab Results HIV 1 RNA Quant (copies/mL)  Date  Value  07/09/2013 <20   01/02/2013 <20   05/22/2012 <20      CD4 T Cell Abs (/uL)  Date Value  07/09/2013 780   01/02/2013 700   05/22/2012 680    Lab Results  Component Value Date   WBC 7.6 01/02/2013   HGB 12.3 01/02/2013   HCT 35.6* 01/02/2013   MCV 77.4* 01/02/2013   PLT 198 01/02/2013   BMET    Component Value Date/Time   NA 140 01/02/2013 1640   K 4.2 01/02/2013 1640   CL 107 01/02/2013 1640   CO2 25 01/02/2013 1640   GLUCOSE 94 01/02/2013 1640   BUN 13 01/02/2013 1640   CREATININE 0.81 01/02/2013 1640   CREATININE 0.75 04/07/2012 1157   CALCIUM 8.7 01/02/2013 1640   GFRNONAA >90 04/07/2012 1157   GFRAA >90 04/07/2012 1157   Lab Results  Component Value Date   ALT 16 01/02/2013   AST 16 01/02/2013   ALKPHOS 124* 01/02/2013   BILITOT 0.2* 01/02/2013   Lab Results  Component Value Date   CHOL 207* 01/02/2013   HDL 61 01/02/2013   LDLCALC 129* 01/02/2013   TRIG 83 01/02/2013   CHOLHDL 3.4 01/02/2013     Assessment: Her HIV infection remains under excellent control. I will continue Atripla.  She has had significant weight gain and obesity. I encouraged her to continue her lifestyle modification, healthier eating habits and exercise even if she is not losing weight. We will make a referral for nutrition counseling.  She has dyslipidemia and hopefully her lifestyle modification will help this. I will repeat a lipid panel before her next visit.  She has tension headaches. She is aware of the triggers that were. I encouraged her to wear her glasses on a more consistent basis.  Plan: 1. Continue current medication 2. Continue lifestyle modification 3. Nutrition counseling 4. Influenza vaccination 5. Followup after lab work in 6 months   Cliffton Asters, MD [facility] for Infectious Disease [facility] [phone] pager   [phone] cell 07/23/2013, 11:10 AM

## 2013-08-02 ENCOUNTER — Ambulatory Visit
Admission: RE | Admit: 2013-08-02 | Discharge: 2013-08-02 | Disposition: A | Discharge status: Home / Self Care | Payer: Managed Care, Other (non HMO) | Source: Ambulatory Visit

## 2013-08-02 DIAGNOSIS — Z1231 Encounter for screening mammogram for malignant neoplasm of breast: Secondary | ICD-10-CM

## 2013-08-12 ENCOUNTER — Ambulatory Visit: Payer: Managed Care, Other (non HMO)

## 2013-10-18 ENCOUNTER — Other Ambulatory Visit: Payer: Self-pay | Admitting: *Deleted

## 2013-10-18 DIAGNOSIS — B20 Human immunodeficiency virus [HIV] disease: Secondary | ICD-10-CM

## 2013-10-18 MED ORDER — EFAVIRENZ-EMTRICITAB-TENOFOVIR 600-200-300 MG PO TABS: 1.0000 | ORAL_TABLET | Freq: Every day | ORAL | Status: DC

## 2014-01-06 ENCOUNTER — Other Ambulatory Visit: Payer: Managed Care, Other (non HMO)

## 2014-01-14 ENCOUNTER — Encounter: Payer: Self-pay | Admitting: *Deleted

## 2014-01-14 ENCOUNTER — Telehealth: Payer: Self-pay | Admitting: *Deleted

## 2014-01-14 DIAGNOSIS — B20 Human immunodeficiency virus [HIV] disease: Secondary | ICD-10-CM

## 2014-01-14 MED ORDER — EFAVIRENZ-EMTRICITAB-TENOFOVIR 600-200-300 MG PO TABS
1.0000 | ORAL_TABLET | Freq: Every day | ORAL | Status: DC
Start: 2014-01-14 — End: 2014-06-17

## 2014-01-14 NOTE — Telephone Encounter (Signed)
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  RX sent to new pharmacy.

## 2014-01-14 NOTE — Telephone Encounter (Signed)
error 

## 2014-01-20 ENCOUNTER — Ambulatory Visit: Payer: Managed Care, Other (non HMO) | Admitting: Internal Medicine

## 2014-01-21 ENCOUNTER — Ambulatory Visit: Payer: Managed Care, Other (non HMO) | Admitting: Internal Medicine

## 2014-05-12 ENCOUNTER — Ambulatory Visit: Payer: Managed Care, Other (non HMO) | Admitting: Internal Medicine

## 2014-05-15 ENCOUNTER — Encounter: Payer: Self-pay | Admitting: Internal Medicine

## 2014-05-15 ENCOUNTER — Ambulatory Visit (INDEPENDENT_AMBULATORY_CARE_PROVIDER_SITE_OTHER): Payer: Managed Care, Other (non HMO) | Admitting: Internal Medicine

## 2014-05-15 VITALS — BP 126/89 | HR 89 | Temp 97.8°F | Wt 181.0 lb

## 2014-05-15 DIAGNOSIS — Z23 Encounter for immunization: Secondary | ICD-10-CM

## 2014-05-15 DIAGNOSIS — Z113 Encounter for screening for infections with a predominantly sexual mode of transmission: Secondary | ICD-10-CM

## 2014-05-15 DIAGNOSIS — B20 Human immunodeficiency virus [HIV] disease: Secondary | ICD-10-CM

## 2014-05-15 NOTE — Progress Notes (Signed)
Patient ID: Alyssa Wilson, female   DOB: [birth year], 43 y.o.   MRN: [id number]          Patient Active Problem List   Diagnosis Date Noted  . Human immunodeficiency virus (HIV) disease 01/22/2008    Priority: High  . Dyslipidemia 07/22/2013    Priority: Medium  . Obesity 07/22/2013    Priority: Medium  . ANXIETY 06/24/2010  . ALLERGIC RHINITIS 02/02/2010  . 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 DEPRESSION 02/05/2008  . TUBAL PREGNANCY 02/05/2008  . 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   EFAVIRENZ-EMTRICITABINE-TENOFOVIR (ATRIPLA) 600-200-300 MG PER TABLET    Take 1 tablet by mouth daily.  Modified Medications   No medications on file  Discontinued Medications   No medications on file    Subjective: Hayde is in for her routine visit. 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 She is very happy with her new job. She has not missed any doses of her Atripla. She recently underwent a biometric screening at work and found that her cholesterol was elevated. She is going to start working with Engineer, maintenance. She is working hard on eating healthier diet and trying to get more exercise. Review of Systems: Pertinent items are noted in HPI.  Past Medical History  Diagnosis Date  . HIV (human immunodeficiency virus infection)   . Headache(784.0)   . Anemia     History  Substance Use Topics  . Smoking status: Never Smoker   . Smokeless tobacco: Never Used  . Alcohol Use: No    No family history on file.  No Known Allergies  Objective: Temp: 97.8 F (36.6 C) (10/08 1612) Temp Source: Oral (10/08 1612) BP: 126/89 mmHg (10/08 1612) Pulse Rate: 89 (10/08 1612) Body mass index is 33.1  kg/(m^2).  General: Her weight is unchanged at 181. She is in very good spirits as usual Lungs: Clear Cor: Regular S1 and S2 with no murmur  Lab Results Lab Results  Component Value Date   WBC 7.6 01/02/2013   HGB 12.3 01/02/2013   HCT 35.6* 01/02/2013   MCV 77.4* 01/02/2013   PLT 198 01/02/2013    Lab Results  Component Value Date   CREATININE 0.81 01/02/2013   BUN 13 01/02/2013   NA 140 01/02/2013   K 4.2 01/02/2013   CL 107 01/02/2013   CO2 25 01/02/2013    Lab Results  Component Value Date   ALT 16 01/02/2013   AST 16 01/02/2013   ALKPHOS 124* 01/02/2013   BILITOT 0.2* 01/02/2013    Lab Results  Component Value Date   CHOL 207* 01/02/2013   HDL 61 01/02/2013   LDLCALC 129* 01/02/2013   TRIG 83 01/02/2013   CHOLHDL 3.4 01/02/2013    Lab Results HIV 1 RNA Quant (copies/mL)  Date Value  07/09/2013 <20   01/02/2013 <20   05/22/2012 <20      CD4 T Cell Abs (/uL)  Date Value  07/09/2013 780   01/02/2013 700   05/22/2012 680      Assessment: Her HIV infection has been under very good control. She's had good CD4 reconstitution over the past 5 years. I will continue Atripla and repeat blood work today  Plan: 1. Continue Atripla 2. Check blood work today 3. Followup in 6 months   Michel Bickers, MD [facility] [place] [phone] pager   [phone] cell 05/15/2014, 4:44 PM

## 2014-05-16 LAB — RPR

## 2014-05-16 LAB — COMPREHENSIVE METABOLIC PANEL
ALBUMIN: 3.9 g/dL (ref 3.5–5.2)
ALT: 14 U/L (ref 0–35)
AST: 13 U/L (ref 0–37)
Alkaline Phosphatase: 117 U/L (ref 39–117)
BILIRUBIN TOTAL: 0.3 mg/dL (ref 0.2–1.2)
BUN: 11 mg/dL (ref 6–23)
CO2: 20 mEq/L (ref 19–32)
Calcium: 8.7 mg/dL (ref 8.4–10.5)
Chloride: 105 mEq/L (ref 96–112)
Creat: 0.73 mg/dL (ref 0.50–1.10)
Glucose, Bld: 86 mg/dL (ref 70–99)
POTASSIUM: 4 meq/L (ref 3.5–5.3)
SODIUM: 137 meq/L (ref 135–145)
Total Protein: 7.2 g/dL (ref 6.0–8.3)

## 2014-05-16 LAB — CBC
HCT: 35.3 % — ABNORMAL LOW (ref 36.0–46.0)
Hemoglobin: 12 g/dL (ref 12.0–15.0)
MCH: 27.2 pg (ref 26.0–34.0)
MCHC: 34 g/dL (ref 30.0–36.0)
MCV: 80 fL (ref 78.0–100.0)
Platelets: 154 10*3/uL (ref 150–400)
RBC: 4.41 MIL/uL (ref 3.87–5.11)
RDW: 14.4 % (ref 11.5–15.5)
WBC: 8.6 10*3/uL (ref 4.0–10.5)

## 2014-05-16 LAB — T-HELPER CELL (CD4) - (RCID CLINIC ONLY)
CD4 % Helper T Cell: 38 % (ref 33–55)
CD4 T Cell Abs: 740 /uL (ref 400–2700)

## 2014-05-17 LAB — HIV-1 RNA QUANT-NO REFLEX-BLD

## 2014-06-09 ENCOUNTER — Encounter: Payer: Self-pay | Admitting: Internal Medicine

## 2014-06-17 ENCOUNTER — Other Ambulatory Visit: Payer: Self-pay | Admitting: Internal Medicine

## 2014-07-14 ENCOUNTER — Other Ambulatory Visit: Payer: Self-pay

## 2014-07-14 DIAGNOSIS — Z1231 Encounter for screening mammogram for malignant neoplasm of breast: Secondary | ICD-10-CM

## 2014-07-24 ENCOUNTER — Other Ambulatory Visit: Payer: Self-pay | Admitting: Family

## 2014-07-24 DIAGNOSIS — E049 Nontoxic goiter, unspecified: Secondary | ICD-10-CM

## 2014-07-25 ENCOUNTER — Ambulatory Visit
Admission: RE | Admit: 2014-07-25 | Discharge: 2014-07-25 | Disposition: A | Discharge status: Home / Self Care | Payer: Managed Care, Other (non HMO) | Source: Ambulatory Visit | Attending: Family | Admitting: Family

## 2014-07-25 ENCOUNTER — Encounter (INDEPENDENT_AMBULATORY_CARE_PROVIDER_SITE_OTHER): Payer: Self-pay

## 2014-07-25 DIAGNOSIS — E049 Nontoxic goiter, unspecified: Secondary | ICD-10-CM

## 2014-08-04 ENCOUNTER — Ambulatory Visit
Admission: RE | Admit: 2014-08-04 | Discharge: 2014-08-04 | Disposition: A | Discharge status: Home / Self Care | Payer: Managed Care, Other (non HMO) | Source: Ambulatory Visit

## 2014-08-04 DIAGNOSIS — Z1231 Encounter for screening mammogram for malignant neoplasm of breast: Secondary | ICD-10-CM

## 2014-11-06 ENCOUNTER — Telehealth: Payer: Self-pay | Admitting: *Deleted

## 2014-11-06 DIAGNOSIS — B20 Human immunodeficiency virus [HIV] disease: Secondary | ICD-10-CM

## 2014-11-06 MED ORDER — EFAVIRENZ-EMTRICITAB-TENOFOVIR 600-200-300 MG PO TABS: 1.0000 | ORAL_TABLET | Freq: Every day | ORAL | Status: DC

## 2014-11-06 NOTE — Telephone Encounter (Signed)
Requested pt call for lab and MD appts.

## 2014-11-12 ENCOUNTER — Other Ambulatory Visit: Payer: Managed Care, Other (non HMO)

## 2014-11-12 DIAGNOSIS — Z79899 Other long term (current) drug therapy: Secondary | ICD-10-CM

## 2014-11-12 DIAGNOSIS — B20 Human immunodeficiency virus [HIV] disease: Secondary | ICD-10-CM

## 2014-11-12 DIAGNOSIS — Z113 Encounter for screening for infections with a predominantly sexual mode of transmission: Secondary | ICD-10-CM

## 2014-11-12 LAB — CBC WITH DIFFERENTIAL/PLATELET
Basophils Absolute: 0.1 10*3/uL (ref 0.0–0.1)
Basophils Relative: 1 % (ref 0–1)
Eosinophils Absolute: 0.1 10*3/uL (ref 0.0–0.7)
Eosinophils Relative: 1 % (ref 0–5)
HEMATOCRIT: 36.4 % (ref 36.0–46.0)
HEMOGLOBIN: 12.5 g/dL (ref 12.0–15.0)
LYMPHS ABS: 2.2 10*3/uL (ref 0.7–4.0)
LYMPHS PCT: 27 % (ref 12–46)
MCH: 27.1 pg (ref 26.0–34.0)
MCHC: 34.3 g/dL (ref 30.0–36.0)
MCV: 79 fL (ref 78.0–100.0)
MONOS PCT: 5 % (ref 3–12)
MPV: 11.8 fL (ref 8.6–12.4)
Monocytes Absolute: 0.4 10*3/uL (ref 0.1–1.0)
NEUTROS PCT: 66 % (ref 43–77)
Neutro Abs: 5.5 10*3/uL (ref 1.7–7.7)
Platelets: 163 10*3/uL (ref 150–400)
RBC: 4.61 MIL/uL (ref 3.87–5.11)
RDW: 14.3 % (ref 11.5–15.5)
WBC: 8.3 10*3/uL (ref 4.0–10.5)

## 2014-11-13 LAB — COMPREHENSIVE METABOLIC PANEL
ALBUMIN: 4 g/dL (ref 3.5–5.2)
ALT: 16 U/L (ref 0–35)
AST: 15 U/L (ref 0–37)
Alkaline Phosphatase: 107 U/L (ref 39–117)
BUN: 11 mg/dL (ref 6–23)
CO2: 26 meq/L (ref 19–32)
Calcium: 8.6 mg/dL (ref 8.4–10.5)
Chloride: 106 mEq/L (ref 96–112)
Creat: 0.82 mg/dL (ref 0.50–1.10)
GLUCOSE: 97 mg/dL (ref 70–99)
POTASSIUM: 4.4 meq/L (ref 3.5–5.3)
SODIUM: 139 meq/L (ref 135–145)
Total Bilirubin: 0.3 mg/dL (ref 0.2–1.2)
Total Protein: 7.2 g/dL (ref 6.0–8.3)

## 2014-11-13 LAB — HIV-1 RNA QUANT-NO REFLEX-BLD: HIV-1 RNA Quant, Log: <1.3 {Log} (ref <1.30)

## 2014-11-13 LAB — LIPID PANEL
CHOL/HDL RATIO: 3.6 ratio
Cholesterol: 206 mg/dL — ABNORMAL HIGH (ref 0–200)
HDL: 58 mg/dL (ref >=46)
LDL Cholesterol: 129 mg/dL — ABNORMAL HIGH (ref 0–99)
TRIGLYCERIDES: 94 mg/dL (ref <150)
VLDL: 19 mg/dL (ref 0–40)

## 2014-11-13 LAB — T-HELPER CELL (CD4) - (RCID CLINIC ONLY)
CD4 T CELL ABS: 830 /uL (ref 400–2700)
CD4 T CELL HELPER: 40 % (ref 33–55)

## 2014-11-13 LAB — URINE CYTOLOGY ANCILLARY ONLY
CHLAMYDIA, DNA PROBE: NEGATIVE
Neisseria Gonorrhea: NEGATIVE

## 2014-11-27 ENCOUNTER — Encounter: Payer: Self-pay | Admitting: Internal Medicine

## 2014-11-27 ENCOUNTER — Ambulatory Visit (INDEPENDENT_AMBULATORY_CARE_PROVIDER_SITE_OTHER): Payer: Managed Care, Other (non HMO) | Admitting: Internal Medicine

## 2014-11-27 DIAGNOSIS — B20 Human immunodeficiency virus [HIV] disease: Secondary | ICD-10-CM

## 2014-11-27 MED ORDER — ELVITEG-COBIC-EMTRICIT-TENOFAF 150-150-200-10 MG PO TABS: 1.0000 | ORAL_TABLET | Freq: Every day | ORAL | Status: DC

## 2014-11-27 NOTE — Progress Notes (Signed)
Patient ID: Alyssa Wilson, female   DOB: 1971-03-28, 44 y.o.   MRN: [id number] HPI: Alyssa Wilson is a 44 y.o. female who is here for her f/u visit.   Allergies: No Known Allergies  Vitals: Temp: 98 F (36.7 C) (04/21 1030) Temp Source: Oral (04/21 1030) BP: 125/81 mmHg (04/21 1030) Pulse Rate: 80 (04/21 1030)  Past Medical History: Past Medical History  Diagnosis Date  . HIV (human immunodeficiency virus infection)   . Headache(784.0)   . Anemia     Social History: History   Social History  . Marital Status: Married    Spouse Name: N/A  . Number of Children: N/A  . Years of Education: N/A   Social History Main Topics  . Smoking status: Never Smoker   . Smokeless tobacco: Never Used  . Alcohol Use: No  . Drug Use: No  . Sexual Activity: Yes     Comment: declined condoms   Other Topics Concern  . None   Social History Narrative    Previous Regimen:   Current Regimen: ATP  Labs: HIV 1 RNA QUANT (copies/mL)  Date Value  11/12/2014 <20  05/15/2014 <20  07/09/2013 <20   CD4 T CELL ABS (/uL)  Date Value  11/12/2014 830  05/15/2014 740  07/09/2013 780   HEP B S AB (no units)  Date Value  02/05/2008 NEG   HEPATITIS B SURFACE AG (no units)  Date Value  02/05/2008 NEG   HCV AB (no units)  Date Value  02/05/2008 NEG    CrCl: CrCl cannot be calculated (Unknown ideal weight.).  Lipids:    Component Value Date/Time   CHOL 206* 11/12/2014 1527   TRIG 94 11/12/2014 1527   HDL 58 11/12/2014 1527   CHOLHDL 3.6 11/12/2014 1527   VLDL 19 11/12/2014 1527   LDLCALC 129* 11/12/2014 1527    Assessment: 44 yo who is doing well on her ATP for HIV. She has on issue with her lipid panel. We are going to change it to [facility]. She has been trying to lose weight so she has been doing kale smoothie in AM. She stated that she is eating lunch consistently so I told her to take her Genvoya daily with lunch. She said she would have not issue with  it.  Recommendations: Stop ATP Start Genvoya 1 PO qday  Wilfred Lacy, PharmD Clinical Infectious [place] [facility] 11/27/2014, 11:10 AM

## 2014-11-27 NOTE — Progress Notes (Signed)
Patient ID: Alyssa Wilson, female   DOB: 1970/09/29, 44 y.o.   MRN: [id number]          Patient Active Problem List   Diagnosis Date Noted  . Human immunodeficiency virus (HIV) disease 01/22/2008    Priority: High  . Dyslipidemia 07/22/2013    Priority: Medium  . Obesity 07/22/2013    Priority: Medium  . ANXIETY 06/24/2010  . ALLERGIC RHINITIS 02/02/2010  . 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 DEPRESSION 02/05/2008  . TUBAL PREGNANCY 02/05/2008  . 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  Modified Medications   No medications on file  Discontinued Medications   ASPIRIN-ACETAMINOPHEN-CAFFEINE (EXCEDRIN MIGRAINE) 250-250-65 MG PER TABLET    Take by mouth every 6 (six) hours as needed for headache.   EFAVIRENZ-EMTRICITABINE-TENOFOVIR (ATRIPLA) 600-200-300 MG PER TABLET    Take 1 tablet by mouth daily.    Subjective: Alyssa Wilson is in for her routine HIV follow-up visit. As usual she never misses a single dose of her Atripla. The only other medication she is currently taking his glucosamine. She feels that helps her with mild knee pain. She continues to work on dietary modification to try to help her lose weight and lower her cholesterol. She has been drinking kale smoothies each morning rather than having her normal breakfast. She is happy that she has been able to lose 4 pounds recently.  Review of Systems: Pertinent items are noted in HPI.  Past Medical History  Diagnosis Date  . HIV (human immunodeficiency virus infection)   . Headache(784.0)   . Anemia     History  Substance Use Topics  . Smoking status: Never Smoker   . Smokeless tobacco: Never Used  . Alcohol Use: No    No family history on file.  No Known Allergies  Objective: Temp: 98 F (36.7 C) (04/21  1030) Temp Source: Oral (04/21 1030) BP: 125/81 mmHg (04/21 1030) Pulse Rate: 80 (04/21 1030) Body mass index is 32.77 kg/(m^2).  General: Her weight is down 2 pounds since her last visit to 179. She is in very good spirits as usual Lungs: Clear Cor: Regular S1 and S2 with no murmur  Abdomen: Soft and nontender  Lab Results Lab Results  Component Value Date   WBC 8.3 11/12/2014   HGB 12.5 11/12/2014   HCT 36.4 11/12/2014   MCV 79.0 11/12/2014   PLT 163 11/12/2014    Lab Results  Component Value Date   CREATININE 0.82 11/12/2014   BUN 11 11/12/2014   NA 139 11/12/2014   K 4.4 11/12/2014   CL 106 11/12/2014   CO2 26 11/12/2014    Lab Results  Component Value Date   ALT 16 11/12/2014   AST 15 11/12/2014   ALKPHOS 107 11/12/2014   BILITOT 0.3 11/12/2014    Lab Results  Component Value Date   CHOL 206* 11/12/2014   HDL 58 11/12/2014   LDLCALC 129* 11/12/2014   TRIG 94 11/12/2014   CHOLHDL 3.6 11/12/2014    Lab Results HIV 1 RNA QUANT (copies/mL)  Date Value  11/12/2014 <20  05/15/2014 <20  07/09/2013 <20   CD4 T CELL ABS (/uL)  Date Value  11/12/2014 830  05/15/2014 740  07/09/2013 780     Assessment: Her HIV infection has been  under very good control. She tolerates Atripla well but it may be having an adverse impact on her LDL cholesterol. I talked to her about antiretroviral options and she agrees with a switch to [place].  Plan: 1. Change Atripla to Genvoya once daily at lunch 2. Followup in 6 months   Michel Bickers, MD [facility] for [place] [phone] pager   [phone] cell 11/27/2014, 10:57 AM

## 2015-07-02 ENCOUNTER — Emergency Department (HOSPITAL_COMMUNITY): Payer: Managed Care, Other (non HMO)

## 2015-07-02 ENCOUNTER — Encounter (HOSPITAL_COMMUNITY): Payer: Self-pay | Admitting: *Deleted

## 2015-07-02 ENCOUNTER — Emergency Department (HOSPITAL_COMMUNITY)
Admission: EM | Admit: 2015-07-02 | Discharge: 2015-07-03 | Disposition: A | Discharge status: Home / Self Care | Payer: Managed Care, Other (non HMO) | Attending: Emergency Medicine | Admitting: Emergency Medicine

## 2015-07-02 DIAGNOSIS — B20 Human immunodeficiency virus [HIV] disease: Secondary | ICD-10-CM | POA: Insufficient documentation

## 2015-07-02 DIAGNOSIS — R079 Chest pain, unspecified: Secondary | ICD-10-CM | POA: Insufficient documentation

## 2015-07-02 DIAGNOSIS — Z79899 Other long term (current) drug therapy: Secondary | ICD-10-CM | POA: Insufficient documentation

## 2015-07-02 DIAGNOSIS — R0602 Shortness of breath: Secondary | ICD-10-CM | POA: Insufficient documentation

## 2015-07-02 DIAGNOSIS — Z7982 Long term (current) use of aspirin: Secondary | ICD-10-CM | POA: Insufficient documentation

## 2015-07-02 DIAGNOSIS — Z862 Personal history of diseases of the blood and blood-forming organs and certain disorders involving the immune mechanism: Secondary | ICD-10-CM | POA: Insufficient documentation

## 2015-07-02 DIAGNOSIS — R42 Dizziness and giddiness: Secondary | ICD-10-CM | POA: Diagnosis not present

## 2015-07-02 LAB — BASIC METABOLIC PANEL
Anion gap: 5 (ref 5–15)
BUN: 12 mg/dL (ref 6–20)
CO2: 26 mmol/L (ref 22–32)
CREATININE: 0.91 mg/dL (ref 0.44–1.00)
Calcium: 9.7 mg/dL (ref 8.9–10.3)
Chloride: 107 mmol/L (ref 101–111)
GFR calc Af Amer: >60 mL/min (ref >60)
GFR calc non Af Amer: >60 mL/min (ref >60)
Glucose, Bld: 104 mg/dL — ABNORMAL HIGH (ref 65–99)
Potassium: 4.2 mmol/L (ref 3.5–5.1)
Sodium: 138 mmol/L (ref 135–145)

## 2015-07-02 LAB — I-STAT TROPONIN, ED: TROPONIN I, POC: 0 ng/mL (ref 0.00–0.08)

## 2015-07-02 LAB — HEPATIC FUNCTION PANEL
ALBUMIN: 3.6 g/dL (ref 3.5–5.0)
ALK PHOS: 79 U/L (ref 38–126)
ALT: 21 U/L (ref 14–54)
AST: 43 U/L — AB (ref 15–41)
BILIRUBIN TOTAL: 0.1 mg/dL — AB (ref 0.3–1.2)
Total Protein: 7.4 g/dL (ref 6.5–8.1)

## 2015-07-02 LAB — D-DIMER, QUANTITATIVE: D-Dimer, Quant: 0.34 ug/mL-FEU (ref 0.00–0.50)

## 2015-07-02 LAB — LIPASE, BLOOD: Lipase: 62 U/L — ABNORMAL HIGH (ref 11–51)

## 2015-07-02 LAB — CBC
HCT: 34.8 % — ABNORMAL LOW (ref 36.0–46.0)
Hemoglobin: 12.3 g/dL (ref 12.0–15.0)
MCH: 28 pg (ref 26.0–34.0)
MCHC: 35.3 g/dL (ref 30.0–36.0)
MCV: 79.3 fL (ref 78.0–100.0)
PLATELETS: 147 10*3/uL — AB (ref 150–400)
RBC: 4.39 MIL/uL (ref 3.87–5.11)
RDW: 13.2 % (ref 11.5–15.5)
WBC: 13.1 10*3/uL — ABNORMAL HIGH (ref 4.0–10.5)

## 2015-07-02 MED ORDER — KETOROLAC TROMETHAMINE 30 MG/ML IJ SOLN
30.0000 mg | Freq: Once | INTRAMUSCULAR | Status: AC
  Administered 2015-07-03: 30 mg via INTRAVENOUS
  Filled 2015-07-02: qty 1

## 2015-07-02 MED ORDER — ONDANSETRON HCL 4 MG/2ML IJ SOLN
4.0000 mg | Freq: Once | INTRAMUSCULAR | Status: AC
  Administered 2015-07-02: 4 mg via INTRAVENOUS
  Filled 2015-07-02: qty 2

## 2015-07-02 MED ORDER — SODIUM CHLORIDE 0.9 % IV BOLUS (SEPSIS)
1000.0000 mL | Freq: Once | INTRAVENOUS | Status: AC
  Administered 2015-07-02: 1000 mL via INTRAVENOUS

## 2015-07-02 MED ORDER — NITROGLYCERIN 0.4 MG SL SUBL
0.4000 mg | SUBLINGUAL_TABLET | SUBLINGUAL | Status: AC | PRN
  Administered 2015-07-02: 0.4 mg via SUBLINGUAL
  Filled 2015-07-02: qty 1

## 2015-07-02 MED ORDER — GI COCKTAIL ~~LOC~~
30.0000 mL | Freq: Once | ORAL | Status: AC
  Administered 2015-07-02: 30 mL via ORAL
  Filled 2015-07-02: qty 30

## 2015-07-02 MED ORDER — MORPHINE SULFATE (PF) 4 MG/ML IV SOLN: 2.0000 mg | Freq: Once | INTRAVENOUS | Status: DC

## 2015-07-02 MED ORDER — GI COCKTAIL ~~LOC~~: 30.0000 mL | Freq: Once | ORAL | Status: DC

## 2015-07-02 NOTE — ED Provider Notes (Signed)
CSN: [id number]     Arrival date & time 07/02/15  2046 History   First MD Initiated Contact with Patient 07/02/15 2059     Chief Complaint  Patient presents with  . Chest Pain     (Consider location/radiation/quality/duration/timing/severity/associated sxs/prior Treatment) HPI Comments: Patient is a 44 year old female with a history of HIV and anemia. She presents to the emergency department for further evaluation of midsternal chest pain which has been constant over the past hour. Patient was lying on a couch when her symptoms began. She reports radiation of the pain to her back. She initially thought that her pain was secondary to indigestion, but it has gotten worse and lasted longer than she anticipated. Patient took 324 mg aspirin prior to arrival. She describes the pain as burning and pressure-like. She reports some lightheadedness at onset of her symptoms. She reports that worsening pain also causes her to "catch my breath". Patient has had no extremity numbness or weakness. She also denies fever, syncope, leg swelling, and vomiting. She reports a hx of CHF in her mother and sister; her sister is now deceased. She has a FHx of ACS in her grandmother in her mid 109's. Patient endorses borderline HLD. She denies hx of ACS, DM, HTN, PE, and DVT.  Patient is a 44 y.o. female presenting with chest pain. The history is provided by the patient. No language interpreter was used.  Chest Pain Associated symptoms: shortness of breath   Associated symptoms: no abdominal pain, no fever, no numbness, not vomiting and no weakness     Past Medical History  Diagnosis Date  . HIV (human immunodeficiency virus infection) ([place])   . Headache(784.0)   . Anemia    Past Surgical History  Procedure Laterality Date  . Cervical conization w/bx    . Laparoscopy abdomen diagnostic      ectopic pregnant  1998  . Abdominal hysterectomy     No family history on file. Social History  Substance Use Topics  .  Smoking status: Never Smoker   . Smokeless tobacco: Never Used  . Alcohol Use: No   OB History    Gravida Para Term Preterm AB TAB SAB Ectopic Multiple Living   3 2   1   1  2       Review of Systems  Constitutional: Negative for fever.  Respiratory: Positive for shortness of breath.   Cardiovascular: Positive for chest pain. Negative for leg swelling.  Gastrointestinal: Negative for vomiting and abdominal pain.  Neurological: Positive for light-headedness. Negative for syncope, weakness and numbness.  All other systems reviewed and are negative.   Allergies  Review of patient's allergies indicates no known allergies.  Home Medications   Prior to Admission medications   Medication Sig Start Date End Date Taking? Authorizing Provider  aspirin 325 MG tablet Take 325 mg by mouth every 6 (six) hours as needed for moderate pain.   Yes Historical Provider, MD  aspirin-acetaminophen-caffeine (EXCEDRIN MIGRAINE) 2392652506 MG tablet Take 2 tablets by mouth every 6 (six) hours as needed for headache.   Yes Historical Provider, MD  benzonatate (TESSALON) 100 MG capsule Take 100 mg by mouth 3 (three) times daily as needed for cough.  05/20/15  Yes Historical Provider, MD  DM-Phenylephrine-Acetaminophen (TYLENOL COLD MULTI-SYMPTOM DAY) 10-5-325 MG TABS Take 2 tablets by mouth daily as needed (for cold).   Yes Historical Provider, MD  elvitegravir-cobicistat-emtricitabine-tenofovir (GENVOYA) 150-150-200-10 MG TABS tablet Take 1 tablet by mouth daily with breakfast. 11/27/14  Yes Jenny Reichmann  Megan Salon, MD  pantoprazole (PROTONIX) 20 MG tablet Take 1 tablet (20 mg total) by mouth daily. 07/03/15   Antonietta Breach, PA-C  sucralfate (CARAFATE) 1 GM/10ML suspension Take 10 mLs (1 g total) by mouth 4 (four) times daily -  with meals and at bedtime. 07/03/15   Antonietta Breach, PA-C   BP 110/80 mmHg  Pulse 70  Temp(Src) 97.4 F (36.3 C) (Oral)  Resp 17  Ht 5\' 2"  (1.575 m)  Wt 85.276 kg  BMI 34.38 kg/m2  SpO2 96%   LMP 03/17/2012   Physical Exam  Constitutional: She is oriented to person, place, and time. She appears well-developed and well-nourished. No distress.  Nontoxic/nonseptic appearing  HENT:  Head: Normocephalic and atraumatic.  Eyes: Conjunctivae and EOM are normal. No scleral icterus.  Neck: Normal range of motion.  Cardiovascular: Normal rate, regular rhythm and intact distal pulses.   Pulmonary/Chest: Effort normal. No respiratory distress. She has no wheezes.  Respirations even and unlabored  Abdominal: Soft. She exhibits no distension. There is no tenderness. There is no rebound.  Soft, nontender. Negative Murphy's sign. No peritoneal signs or guarding.  Musculoskeletal: Normal range of motion.  Neurological: She is alert and oriented to person, place, and time. She exhibits normal muscle tone. Coordination normal.  GCS 15. Speech is goal oriented. Patient moving all extremities.  Skin: Skin is warm and dry. No rash noted. She is not diaphoretic. No erythema. No pallor.  Psychiatric: She has a normal mood and affect. Her behavior is normal.  Nursing note and vitals reviewed.   ED Course  Procedures (including critical care time) Labs Review Labs Reviewed  BASIC METABOLIC PANEL - Abnormal; Notable for the following:    Glucose, Bld 104 (*)    All other components within normal limits  CBC - Abnormal; Notable for the following:    WBC 13.1 (*)    HCT 34.8 (*)    Platelets 147 (*)    All other components within normal limits  LIPASE, BLOOD - Abnormal; Notable for the following:    Lipase 62 (*)    All other components within normal limits  HEPATIC FUNCTION PANEL - Abnormal; Notable for the following:    AST 43 (*)    Total Bilirubin 0.1 (*)    Bilirubin, Direct <0.1 (*)    All other components within normal limits  D-DIMER, QUANTITATIVE (NOT AT [facility])  Randolm Idol, ED  Randolm Idol, ED    Imaging Review Dg Chest 2 View  07/02/2015  CLINICAL DATA:   Midsternal chest pain beginning about an hour ago. Nonsmoker. EXAM: CHEST  2 VIEW COMPARISON:  None. FINDINGS: The heart size and mediastinal contours are within normal limits. Both lungs are clear. The visualized skeletal structures are unremarkable. IMPRESSION: No active cardiopulmonary disease. Electronically Signed   By: Lucienne Capers M.D.   On: 07/02/2015 21:45     I have personally reviewed and evaluated these images and lab results as part of my medical decision-making.   EKG Interpretation   Date/Time:  Thursday July 02 2015 20:50:58 EST Ventricular Rate:  77 PR Interval:  188 QRS Duration: 68 QT Interval:  358 QTC Calculation: 405 R Axis:   35 Text Interpretation:  Normal sinus rhythm Low voltage QRS Borderline ECG  Confirmed by DELO  MD, DOUGLAS ([id number]) on 07/02/2015 9:45:16 PM      2230 - PERC negative. D dimer negative. Doubt PE or dissection.  0030 - Heart score 3 c/w low risk of ACS (based  on suspicion, age, and risk factors). MDM   Final diagnoses:  Chest pain, unspecified chest pain type    44 year old female to the emergency department for evaluation of chest pain, onset 1 hour PTA. Patient with a reassuring cardiac work up today. Troponin negative x 2. EKG nonischemic. CXR not concerning for PTX, PNA, or mediastinal widening. D dimer is negative making dissection less likely; also low suspicion for PE. Patient is PERC negative. This does not appear to be a picture of cholecystitis. Patient has normal LFTs and bilirubin. No concern for pancreatitis.   Suspect gastritis or indigestion. Patient has had some symptom improvement with belching. Will d/c with PCP f/u as well as protonix and carafate. Return precautions discussed and provided. Patient agreeable to plan with no unaddressed concerns. Patient discharged in good condition; VSS.   Filed Vitals:   07/02/15 2330 07/02/15 2345 07/03/15 0000 07/03/15 0015  BP: 116/81 113/76 113/77 110/80  Pulse: 69 73 66  70  Temp:      TempSrc:      Resp: 15 15 19 17   Height:      Weight:      SpO2: 94% 96% 96% 96%     Antonietta Breach, PA-C 07/03/15 0037  Veryl Speak, MD 07/06/15 1034

## 2015-07-02 NOTE — ED Notes (Signed)
Pt c/o mid sternal PC starting 45 minutes ago. States that she thought it was gas but the pain has lasted too long.

## 2015-07-03 LAB — I-STAT TROPONIN, ED: Troponin i, poc: 0 ng/mL (ref 0.00–0.08)

## 2015-07-03 MED ORDER — SUCRALFATE 1 GM/10ML PO SUSP: 1.0000 g | Freq: Three times a day (TID) | ORAL | Status: DC

## 2015-07-03 MED ORDER — PANTOPRAZOLE SODIUM 20 MG PO TBEC: 20.0000 mg | DELAYED_RELEASE_TABLET | Freq: Every day | ORAL | Status: DC

## 2015-07-03 NOTE — Discharge Instructions (Signed)
Your heart work up today was reassuring. You also had no evidence of pneumonia, a collapsed lung, fluid around your lung, a widening of your heart vessels, or a blood clot in your lungs. Your bloodwork also showed normal pancreas and liver functions. It is possible that you pain is due to indigestion or reflux. Try taking Carafate and protonix for this. Follow up with your primary care doctor. Feel free to return to the ED if your symptoms significantly worsen.  Indigestion Indigestion is a feeling of pain, discomfort, burning, or fullness in the upper part of your abdomen. It can come and go. It may occur frequently or rarely. Indigestion tends to occur while you are eating or right after you have finished eating. It may be worse at night and while bending over or lying down. HOME CARE INSTRUCTIONS Take these actions to decrease your pain or discomfort and to help avoid complications. Diet  Follow a diet as recommended by your health care provider. This may involve avoiding foods and drinks such as:  Coffee and tea (with or without caffeine).  Drinks that contain alcohol.  Energy drinks and sports drinks.  Carbonated drinks or sodas.  Chocolate and cocoa.  Peppermint and mint flavorings.  Garlic and onions.  Horseradish.  Spicy and acidic foods, including peppers, chili powder, curry powder, vinegar, hot sauces, and barbecue sauce.  Citrus fruit juices and citrus fruits, such as oranges, lemons, and limes.  Tomato-based foods, such as red sauce, chili, salsa, and pizza with red sauce.  Fried and fatty foods, such as donuts, french fries, potato chips, and high-fat dressings.  High-fat meats, such as hot dogs and fatty cuts of red and white meats, such as rib eye steak, sausage, ham, and bacon.  High-fat dairy items, such as whole milk, butter, and cream cheese.  Eat small, frequent meals instead of large meals.  Avoid drinking large amounts of liquid with your meals.  Avoid  eating meals during the 2-3 hours before bedtime.  Avoid lying down right after you eat.  Do not exercise right after you eat. General Instructions  Pay attention to any changes in your symptoms.  Take over-the-counter and prescription medicines only as told by your health care provider. Do not take aspirin, ibuprofen, or other NSAIDs unless your health care provider told you to do so.  Do not use any tobacco products, including cigarettes, chewing tobacco, and e-cigarettes. If you need help quitting, ask your health care provider.  Wear loose-fitting clothing. Do not wear anything tight around your waist that causes pressure on your abdomen.  Raise (elevate) the head of your bed about 6 inches (15 cm).  Try to reduce your stress, such as with yoga or meditation. If you need help reducing stress, ask your health care provider.  If you are overweight, reduce your weight to an amount that is healthy for you. Ask your health care provider for guidance about a safe weight loss goal.  Keep all follow-up visits as told by your health care provider. This is important. SEEK MEDICAL CARE IF:  You have new symptoms.  You have unexplained weight loss.  You have difficulty swallowing, or it hurts to swallow.  Your symptoms do not improve with treatment.  Your symptoms last for more than two days.  You have a fever.  You vomit. SEEK IMMEDIATE MEDICAL CARE IF:  You have pain in your arms, neck, jaw, teeth, or back.  You feel sweaty, dizzy, or light-headed.  You faint.  You  have chest pain or shortness of breath.  You cannot stop vomiting, or you vomit blood.  Your stool is bloody or black.  You have severe pain in your abdomen.   This information is not intended to replace advice given to you by your health care provider. Make sure you discuss any questions you have with your health care provider.   Document Released: 09/01/2004 Document Revised: 04/15/2015 Document Reviewed:  11/19/2014 Elsevier Interactive Patient Education 2016 [employer]  Nonspecific Chest Pain  Chest pain can be caused by many different conditions. There is always a chance that your pain could be related to something serious, such as a heart attack or a blood clot in your lungs. Chest pain can also be caused by conditions that are not life-threatening. If you have chest pain, it is very important to follow up with your health care provider. CAUSES  Chest pain can be caused by:  Heartburn.  Pneumonia or bronchitis.  Anxiety or stress.  Inflammation around your heart (pericarditis) or lung (pleuritis or pleurisy).  A blood clot in your lung.  A collapsed lung (pneumothorax). It can develop suddenly on its own (spontaneous pneumothorax) or from trauma to the chest.  Shingles infection (varicella-zoster virus).  Heart attack.  Damage to the bones, muscles, and cartilage that make up your chest wall. This can include:  Bruised bones due to injury.  Strained muscles or cartilage due to frequent or repeated coughing or overwork.  Fracture to one or more ribs.  Sore cartilage due to inflammation (costochondritis). RISK FACTORS  Risk factors for chest pain may include:  Activities that increase your risk for trauma or injury to your chest.  Respiratory infections or conditions that cause frequent coughing.  Medical conditions or overeating that can cause heartburn.  Heart disease or family history of heart disease.  Conditions or health behaviors that increase your risk of developing a blood clot.  Having had chicken pox (varicella zoster). SIGNS AND SYMPTOMS Chest pain can feel like:  Burning or tingling on the surface of your chest or deep in your chest.  Crushing, pressure, aching, or squeezing pain.  Dull or sharp pain that is worse when you move, cough, or take a deep breath.  Pain that is also felt in your back, neck, shoulder, or arm, or pain that spreads to  any of these areas. Your chest pain may come and go, or it may stay constant. DIAGNOSIS Lab tests or other studies may be needed to find the cause of your pain. Your health care provider may have you take a test called an ambulatory ECG (electrocardiogram). An ECG records your heartbeat patterns at the time the test is performed. You may also have other tests, such as:  Transthoracic echocardiogram (TTE). During echocardiography, sound waves are used to create a picture of all of the heart structures and to look at how blood flows through your heart.  Transesophageal echocardiogram (TEE).This is a more advanced imaging test that obtains images from inside your body. It allows your health care provider to see your heart in finer detail.  Cardiac monitoring. This allows your health care provider to monitor your heart rate and rhythm in real time.  Holter monitor. This is a portable device that records your heartbeat and can help to diagnose abnormal heartbeats. It allows your health care provider to track your heart activity for several days, if needed.  Stress tests. These can be done through exercise or by taking medicine that makes your heart  beat more quickly.  Blood tests.  Imaging tests. TREATMENT  Your treatment depends on what is causing your chest pain. Treatment may include:  Medicines. These may include:  Acid blockers for heartburn.  Anti-inflammatory medicine.  Pain medicine for inflammatory conditions.  Antibiotic medicine, if an infection is present.  Medicines to dissolve blood clots.  Medicines to treat coronary artery disease.  Supportive care for conditions that do not require medicines. This may include:  Resting.  Applying heat or cold packs to injured areas.  Limiting activities until pain decreases. HOME CARE INSTRUCTIONS  If you were prescribed an antibiotic medicine, finish it all even if you start to feel better.  Avoid any activities that bring  on chest pain.  Do not use any tobacco products, including cigarettes, chewing tobacco, or electronic cigarettes. If you need help quitting, ask your health care provider.  Do not drink alcohol.  Take medicines only as directed by your health care provider.  Keep all follow-up visits as directed by your health care provider. This is important. This includes any further testing if your chest pain does not go away.  If heartburn is the cause for your chest pain, you may be told to keep your head raised (elevated) while sleeping. This reduces the chance that acid will go from your stomach into your esophagus.  Make lifestyle changes as directed by your health care provider. These may include:  Getting regular exercise. Ask your health care provider to suggest some activities that are safe for you.  Eating a heart-healthy diet. A registered dietitian can help you to learn healthy eating options.  Maintaining a healthy weight.  Managing diabetes, if necessary.  Reducing stress. SEEK MEDICAL CARE IF:  Your chest pain does not go away after treatment.  You have a rash with blisters on your chest.  You have a fever. SEEK IMMEDIATE MEDICAL CARE IF:   Your chest pain is worse.  You have an increasing cough, or you cough up blood.  You have severe abdominal pain.  You have severe weakness.  You faint.  You have chills.  You have sudden, unexplained chest discomfort.  You have sudden, unexplained discomfort in your arms, back, neck, or jaw.  You have shortness of breath at any time.  You suddenly start to sweat, or your skin gets clammy.  You feel nauseous or you vomit.  You suddenly feel light-headed or dizzy.  Your heart begins to beat quickly, or it feels like it is skipping beats. These symptoms may represent a serious problem that is an emergency. Do not wait to see if the symptoms will go away. Get medical help right away. Call your local emergency services (911 in  the [place] Do not drive yourself to the hospital.   This information is not intended to replace advice given to you by your health care provider. Make sure you discuss any questions you have with your health care provider.   Document Released: 05/04/2005 Document Revised: 08/15/2014 Document Reviewed: 02/28/2014 Elsevier Interactive Patient Education [employer].

## 2015-07-10 ENCOUNTER — Other Ambulatory Visit: Payer: Self-pay | Admitting: Family Medicine

## 2015-07-10 ENCOUNTER — Other Ambulatory Visit: Payer: Self-pay

## 2015-07-10 DIAGNOSIS — R110 Nausea: Secondary | ICD-10-CM

## 2015-07-10 DIAGNOSIS — R1031 Right lower quadrant pain: Secondary | ICD-10-CM

## 2015-07-10 DIAGNOSIS — Z1231 Encounter for screening mammogram for malignant neoplasm of breast: Secondary | ICD-10-CM

## 2015-07-13 ENCOUNTER — Encounter (HOSPITAL_COMMUNITY): Payer: Self-pay | Admitting: *Deleted

## 2015-07-13 ENCOUNTER — Ambulatory Visit
Admission: RE | Admit: 2015-07-13 | Discharge: 2015-07-13 | Disposition: A | Discharge status: Home / Self Care | Payer: Managed Care, Other (non HMO) | Source: Ambulatory Visit | Attending: Family Medicine | Admitting: Family Medicine

## 2015-07-13 ENCOUNTER — Emergency Department (HOSPITAL_COMMUNITY): Payer: Managed Care, Other (non HMO)

## 2015-07-13 ENCOUNTER — Emergency Department (HOSPITAL_COMMUNITY)
Admission: EM | Admit: 2015-07-13 | Discharge: 2015-07-13 | Disposition: A | Discharge status: Home / Self Care | Payer: Managed Care, Other (non HMO) | Attending: Physician Assistant | Admitting: Physician Assistant

## 2015-07-13 DIAGNOSIS — N838 Other noninflammatory disorders of ovary, fallopian tube and broad ligament: Secondary | ICD-10-CM

## 2015-07-13 DIAGNOSIS — R110 Nausea: Secondary | ICD-10-CM

## 2015-07-13 DIAGNOSIS — Z79899 Other long term (current) drug therapy: Secondary | ICD-10-CM | POA: Diagnosis not present

## 2015-07-13 DIAGNOSIS — N839 Noninflammatory disorder of ovary, fallopian tube and broad ligament, unspecified: Secondary | ICD-10-CM | POA: Diagnosis not present

## 2015-07-13 DIAGNOSIS — D649 Anemia, unspecified: Secondary | ICD-10-CM | POA: Diagnosis not present

## 2015-07-13 DIAGNOSIS — R1903 Right lower quadrant abdominal swelling, mass and lump: Secondary | ICD-10-CM

## 2015-07-13 DIAGNOSIS — Z7982 Long term (current) use of aspirin: Secondary | ICD-10-CM | POA: Diagnosis not present

## 2015-07-13 DIAGNOSIS — R1031 Right lower quadrant pain: Secondary | ICD-10-CM | POA: Diagnosis present

## 2015-07-13 DIAGNOSIS — Z21 Asymptomatic human immunodeficiency virus [HIV] infection status: Secondary | ICD-10-CM | POA: Insufficient documentation

## 2015-07-13 LAB — LIPASE, BLOOD: Lipase: 45 U/L (ref 11–51)

## 2015-07-13 LAB — CBC
HEMATOCRIT: 34.5 % — AB (ref 36.0–46.0)
HEMOGLOBIN: 12.1 g/dL (ref 12.0–15.0)
MCH: 27.8 pg (ref 26.0–34.0)
MCHC: 35.1 g/dL (ref 30.0–36.0)
MCV: 79.3 fL (ref 78.0–100.0)
Platelets: 159 10*3/uL (ref 150–400)
RBC: 4.35 MIL/uL (ref 3.87–5.11)
RDW: 13.1 % (ref 11.5–15.5)
WBC: 12.9 10*3/uL — ABNORMAL HIGH (ref 4.0–10.5)

## 2015-07-13 LAB — URINALYSIS, ROUTINE W REFLEX MICROSCOPIC
BILIRUBIN URINE: NEGATIVE
Glucose, UA: NEGATIVE mg/dL
Hgb urine dipstick: NEGATIVE
Ketones, ur: 15 mg/dL — AB
Leukocytes, UA: NEGATIVE
NITRITE: NEGATIVE
PH: 6 (ref 5.0–8.0)
Protein, ur: NEGATIVE mg/dL
SPECIFIC GRAVITY, URINE: 1.01 (ref 1.005–1.030)

## 2015-07-13 LAB — COMPREHENSIVE METABOLIC PANEL
ALBUMIN: 3.6 g/dL (ref 3.5–5.0)
ALT: 21 U/L (ref 14–54)
ANION GAP: 5 (ref 5–15)
AST: 15 U/L (ref 15–41)
Alkaline Phosphatase: 79 U/L (ref 38–126)
BUN: 9 mg/dL (ref 6–20)
CHLORIDE: 106 mmol/L (ref 101–111)
CO2: 23 mmol/L (ref 22–32)
Calcium: 9 mg/dL (ref 8.9–10.3)
Creatinine, Ser: 1.19 mg/dL — ABNORMAL HIGH (ref 0.44–1.00)
GFR calc Af Amer: >60 mL/min (ref >60)
GFR calc non Af Amer: 54 mL/min — ABNORMAL LOW (ref >60)
GLUCOSE: 105 mg/dL — AB (ref 65–99)
POTASSIUM: 3.7 mmol/L (ref 3.5–5.1)
SODIUM: 134 mmol/L — AB (ref 135–145)
TOTAL PROTEIN: 7.3 g/dL (ref 6.5–8.1)
Total Bilirubin: 0.4 mg/dL (ref 0.3–1.2)

## 2015-07-13 MED ORDER — IOPAMIDOL (ISOVUE-300) INJECTION 61%
100.0000 mL | Freq: Once | INTRAVENOUS | Status: AC | PRN
  Administered 2015-07-13: 100 mL via INTRAVENOUS

## 2015-07-13 MED ORDER — KETOROLAC TROMETHAMINE 15 MG/ML IJ SOLN
15.0000 mg | Freq: Once | INTRAMUSCULAR | Status: AC
Start: 2015-07-13 — End: 2015-07-13
  Administered 2015-07-13: 15 mg via INTRAMUSCULAR
  Filled 2015-07-13: qty 1

## 2015-07-13 NOTE — Discharge Instructions (Signed)
Pelvic Mass A pelvic mass is an abnormal growth in the pelvis. The pelvis is the area between your hip bones. It includes the bladder and the rectum in males and females, and also the uterus and ovaries in females. CAUSES Many things can cause a pelvic mass, including:  Cancer.  Fibroids of the uterus.  Ovarian cysts.  Infection.  Ectopic pregnancy. SIGNS AND SYMPTOMS Symptoms of a pelvic mass may include:  Cramping.  Nausea.  Diarrhea.  Fever.  Vomiting.  Weakness.  Pain in the pelvis, side, or back.  Weight loss.  Constipation.  Problems with vaginal bleeding, including:  Light or heavy bleeding with or without blood clots.  Irregular menstruation.  Pain with menstruation.  Problems with urination, including:  Frequent urination.  Inability to empty the bladder completely.  Urinating very small amounts.  Pain with urination.  Bloody urine. Some pelvic masses do not cause symptoms. DIAGNOSIS To make a diagnosis, your health care provider will need to learn more about the mass. You may have tests or procedures done, such as:  Blood tests.  X-rays.  Ultrasound.  CT scan.  MRI.  A surgery to look inside of your abdomen with cameras (laparoscopy).  A biopsy that is performed with a needle or during laparoscopy or surgery. In some cases, what seemed like a pelvic mass may actually be something else, such as a mass in one of the organs that are near the pelvis, an infection (abscess) or scar tissue (adhesions) that formed after a surgery. TREATMENT Treatment will depend on the cause of the mass. HOME CARE INSTRUCTIONS What you need to do at home will depend on the cause of the mass. Follow the instructions that your health care provider gives to you. In general:  Keep all follow-up visits as directed by your health care provider. This is important.  Take medicines only as directed by your health care provider.  Follow any restrictions that  are given to you by your health care provider. SEEK MEDICAL CARE IF:  You develop new symptoms. SEEK IMMEDIATE MEDICAL CARE IF:  You vomit bright red blood or vomit material that looks like coffee grounds.  You have blood in your stools, or the stools turn black and tarry.  You have an abnormal or increased amount of vaginal bleeding.  You have a fever.  You develop easy bruising or bleeding.  You develop sudden or worsening pain that is not controlled by your medicine.  You feel worsening weakness, or you have a fainting episode.  You feel that the mass has suddenly gotten larger.  You develop severe bloating in your abdomen or your pelvis.  You cannot pass any urine.  You are unable to have a bowel movement.   This information is not intended to replace advice given to you by your health care provider. Make sure you discuss any questions you have with your health care provider.   Document Released: 11/01/2006 Document Revised: 08/15/2014 Document Reviewed: 03/10/2014 Elsevier Interactive Patient Education 2016 [employer]  

## 2015-07-13 NOTE — ED Provider Notes (Signed)
CSN: [id number]     Arrival date & time 07/13/15  1419 History   First MD Initiated Contact with Patient 07/13/15 1840     Chief Complaint  Patient presents with  . Abdominal Pain   Patient is a 44 y.o. female presenting with abdominal pain. The history is provided by the patient.  Abdominal Pain Pain location:  RLQ Pain quality: heavy   Pain radiates to:  Does not radiate Pain severity:  Moderate Onset quality:  Gradual Duration:  2 weeks Timing:  Constant Progression:  Unchanged Chronicity:  New Relieved by:  Nothing Ineffective treatments:  Acetaminophen Associated symptoms: no chest pain, no constipation, no dysuria, no fever, no hematuria, no nausea, no shortness of breath, no sore throat, no vaginal bleeding, no vaginal discharge and no vomiting     Past Medical History  Diagnosis Date  . HIV (human immunodeficiency virus infection) ([place])   . Headache(784.0)   . Anemia    Past Surgical History  Procedure Laterality Date  . Cervical conization w/bx    . Laparoscopy abdomen diagnostic      ectopic pregnant  1998  . Abdominal hysterectomy     History reviewed. No pertinent family history. Social History  Substance Use Topics  . Smoking status: Never Smoker   . Smokeless tobacco: Never Used  . Alcohol Use: No   OB History    Gravida Para Term Preterm AB TAB SAB Ectopic Multiple Living   3 2   1   1  2      Review of Systems  Constitutional: Negative for fever.  HENT: Negative for rhinorrhea and sore throat.   Eyes: Negative for visual disturbance.  Respiratory: Negative for chest tightness and shortness of breath.   Cardiovascular: Negative for chest pain and palpitations.  Gastrointestinal: Positive for abdominal pain. Negative for nausea, vomiting and constipation.  Genitourinary: Negative for dysuria, hematuria, vaginal bleeding and vaginal discharge.  Musculoskeletal: Negative for back pain and neck pain.  Skin: Negative for rash.  Neurological: Negative  for dizziness and headaches.  Psychiatric/Behavioral: Negative for confusion.  All other systems reviewed and are negative.  Allergies  Review of patient's allergies indicates no known allergies.  Home Medications   Prior to Admission medications   Medication Sig Start Date End Date Taking? Authorizing Provider  aspirin 325 MG tablet Take 325 mg by mouth every 6 (six) hours as needed for moderate pain.   Yes Historical Provider, MD  aspirin-acetaminophen-caffeine (EXCEDRIN MIGRAINE) [phone] MG tablet Take 2 tablets by mouth every 6 (six) hours as needed for headache.   Yes Historical Provider, MD  Cholecalciferol (VITAMIN D PO) Take 1 tablet by mouth daily.   Yes Historical Provider, MD  elvitegravir-cobicistat-emtricitabine-tenofovir (GENVOYA) 150-150-200-10 MG TABS tablet Take 1 tablet by mouth daily with breakfast. 11/27/14  Yes Michel Bickers, MD  ferrous sulfate 325 (65 FE) MG tablet Take 325 mg by mouth daily with breakfast.   Yes Historical Provider, MD  pantoprazole (PROTONIX) 20 MG tablet Take 1 tablet (20 mg total) by mouth daily. 07/03/15  Yes Antonietta Breach, PA-C  sucralfate (CARAFATE) 1 GM/10ML suspension Take 10 mLs (1 g total) by mouth 4 (four) times daily -  with meals and at bedtime. 07/03/15  Yes Antonietta Breach, PA-C  sulfamethoxazole-trimethoprim (BACTRIM DS,SEPTRA DS) 800-160 MG tablet Take 1 tablet by mouth 2 (two) times daily. 07/10/15  Yes Historical Provider, MD   BP 110/80 mmHg  Pulse 73  Temp(Src) 98.2 F (36.8 C) (Oral)  Resp 18  Ht 5\' 2"  ([id number] m)  Wt 579FGE kg  BMI 33.65 kg/m2  SpO2 95%  LMP 03/17/2012 Physical Exam  Constitutional: She is oriented to person, place, and time. She appears well-developed and well-nourished. No distress.  HENT:  Head: Normocephalic and atraumatic.  Mouth/Throat: Oropharynx is clear and moist.  Eyes: EOM are normal. Pupils are equal, round, and reactive to light.  Neck: Neck supple. No JVD present.  Cardiovascular: Normal rate,  regular rhythm, normal heart sounds and intact distal pulses.  Exam reveals no gallop.   No murmur heard. Pulmonary/Chest: Effort normal and breath sounds normal. She has no wheezes. She has no rales.  Abdominal: Soft. She exhibits no distension. There is tenderness in the right lower quadrant. There is no rebound and no guarding.  Musculoskeletal: Normal range of motion. She exhibits no tenderness.  Neurological: She is alert and oriented to person, place, and time. No cranial nerve deficit. She exhibits normal muscle tone.  Skin: Skin is warm and dry. No rash noted.  Psychiatric: Her behavior is normal.    ED Course  Procedures  None   Labs Review Labs Reviewed  COMPREHENSIVE METABOLIC PANEL - Abnormal; Notable for the following:    Sodium 134 (*)    Glucose, Bld 105 (*)    Creatinine, Ser 1.19 (*)    GFR calc non Af Amer 54 (*)    All other components within normal limits  CBC - Abnormal; Notable for the following:    WBC 12.9 (*)    HCT 34.5 (*)    All other components within normal limits  URINALYSIS, ROUTINE W REFLEX MICROSCOPIC (NOT AT [facility]) - Abnormal; Notable for the following:    Ketones, ur 15 (*)    All other components within normal limits  LIPASE, BLOOD    Imaging Review US Transvaginal Non-ob  07/13/2015  CLINICAL DATA:  Right ovarian cyst on CT EXAM: TRANSABDOMINAL AND TRANSVAGINAL ULTRASOUND OF PELVIS DOPPLER ULTRASOUND OF OVARIES TECHNIQUE: Both transabdominal and transvaginal ultrasound examinations of the pelvis were performed. Transabdominal technique was performed for global imaging of the pelvis including uterus, ovaries, adnexal regions, and pelvic cul-de-sac. It was necessary to proceed with endovaginal exam following the transabdominal exam to visualize the right adnexa. Color and duplex Doppler ultrasound was utilized to evaluate blood flow to the ovaries. COMPARISON:  CT abdomen pelvis dated 07/13/2015 at 1041 hours. Pelvic ultrasound dated 04/10/2012.  FINDINGS: Uterus Surgically absent. Right ovary 4.6 x 5.5 x 4.7 cm predominantly solid-appearing echogenic lesion in the right adnexa, possibly reflecting clot within a hemorrhagic cyst. Possible normal appearing ovarian tissue along the periphery of the lesion. Left ovary Measurements: 2.2 x 1.0 x 1.6 cm. Normal appearance/no adnexal mass. Pulsed Doppler evaluation of both ovaries demonstrates normal low-resistance arterial and venous waveforms. Other findings Moderate pelvic fluid with layering debris/hemorrhage. IMPRESSION: 5.5 cm complex right ovarian lesion, possibly reflecting clot within a hemorrhagic cyst. Follow-up pelvic ultrasound is suggested in 6-12 weeks. No evidence of ovarian torsion. Moderate pelvic fluid with layering debris/ hemorrhage. Overall appearance favors recent hemorrhagic cyst rupture. Prior hysterectomy. Electronically Signed   By: Julian Hy M.D.   On: 07/13/2015 21:26   US Pelvis Complete  07/13/2015  CLINICAL DATA:  Right ovarian cyst on CT EXAM: TRANSABDOMINAL AND TRANSVAGINAL ULTRASOUND OF PELVIS DOPPLER ULTRASOUND OF OVARIES TECHNIQUE: Both transabdominal and transvaginal ultrasound examinations of the pelvis were performed. Transabdominal technique was performed for global imaging of the pelvis including uterus, ovaries, adnexal regions, and pelvic cul-de-sac. It  was necessary to proceed with endovaginal exam following the transabdominal exam to visualize the right adnexa. Color and duplex Doppler ultrasound was utilized to evaluate blood flow to the ovaries. COMPARISON:  CT abdomen pelvis dated 07/13/2015 at 1041 hours. Pelvic ultrasound dated 04/10/2012. FINDINGS: Uterus Surgically absent. Right ovary 4.6 x 5.5 x 4.7 cm predominantly solid-appearing echogenic lesion in the right adnexa, possibly reflecting clot within a hemorrhagic cyst. Possible normal appearing ovarian tissue along the periphery of the lesion. Left ovary Measurements: 2.2 x 1.0 x 1.6 cm. Normal  appearance/no adnexal mass. Pulsed Doppler evaluation of both ovaries demonstrates normal low-resistance arterial and venous waveforms. Other findings Moderate pelvic fluid with layering debris/hemorrhage. IMPRESSION: 5.5 cm complex right ovarian lesion, possibly reflecting clot within a hemorrhagic cyst. Follow-up pelvic ultrasound is suggested in 6-12 weeks. No evidence of ovarian torsion. Moderate pelvic fluid with layering debris/ hemorrhage. Overall appearance favors recent hemorrhagic cyst rupture. Prior hysterectomy. Electronically Signed   By: Julian Hy M.D.   On: 07/13/2015 21:26   Ct Abdomen Pelvis W Contrast  07/13/2015  CLINICAL DATA:  Right lower quadrant pain.  Nausea EXAM: CT ABDOMEN AND PELVIS WITH CONTRAST TECHNIQUE: Multidetector CT imaging of the abdomen and pelvis was performed using the standard protocol following bolus administration of intravenous contrast. CONTRAST:  171mL ISOVUE-300 IOPAMIDOL (ISOVUE-300) INJECTION 61% COMPARISON:  CT abdomen pelvis 04/07/2012 FINDINGS: Lower chest:  Lung bases clear.  No infiltrate or effusion Hepatobiliary: Normal liver. No mass or fatty infiltration. Gallbladder and bile ducts normal. Portal vein patent. Pancreas: Negative Spleen: Negative Adrenals/Urinary Tract: Negative for renal mass or obstruction. No urinary tract calculi identified. Urinary bladder is partially filled and normal Stomach/Bowel: Moderate stool throughout the colon compatible with constipation. Normal appendix. Retrocecal appendix. No bowel thickening or mass lesion. Vascular/Lymphatic: Early atherosclerotic disease in the aorta. No aneurysm. No lymphadenopathy. Reproductive: Large cyst in the right adnexum measuring 6.5 x 7.2 x 7.4 cm. This appears to have homogeneous water density and most likely is a functional cyst however close follow-up is recommended to exclude a cystic neoplasm of the ovary. Hysterectomy. No mass in the left adnexum. Other: No free fluid.  Musculoskeletal: Negative IMPRESSION: Large cyst in the right adnexum most likely a functional cyst. Follow-up ultrasound recommended in 6 weeks to exclude cystic neoplasm. Normal appendix Electronically Signed   By: Franchot Gallo M.D.   On: 07/13/2015 11:59   [place] Art/ven Flow Abd Pelv Doppler  07/13/2015  CLINICAL DATA:  Right ovarian cyst on CT EXAM: TRANSABDOMINAL AND TRANSVAGINAL ULTRASOUND OF PELVIS DOPPLER ULTRASOUND OF OVARIES TECHNIQUE: Both transabdominal and transvaginal ultrasound examinations of the pelvis were performed. Transabdominal technique was performed for global imaging of the pelvis including uterus, ovaries, adnexal regions, and pelvic cul-de-sac. It was necessary to proceed with endovaginal exam following the transabdominal exam to visualize the right adnexa. Color and duplex Doppler ultrasound was utilized to evaluate blood flow to the ovaries. COMPARISON:  CT abdomen pelvis dated 07/13/2015 at 1041 hours. Pelvic ultrasound dated 04/10/2012. FINDINGS: Uterus Surgically absent. Right ovary 4.6 x 5.5 x 4.7 cm predominantly solid-appearing echogenic lesion in the right adnexa, possibly reflecting clot within a hemorrhagic cyst. Possible normal appearing ovarian tissue along the periphery of the lesion. Left ovary Measurements: 2.2 x 1.0 x 1.6 cm. Normal appearance/no adnexal mass. Pulsed Doppler evaluation of both ovaries demonstrates normal low-resistance arterial and venous waveforms. Other findings Moderate pelvic fluid with layering debris/hemorrhage. IMPRESSION: 5.5 cm complex right ovarian lesion, possibly reflecting clot within a hemorrhagic  cyst. Follow-up pelvic ultrasound is suggested in 6-12 weeks. No evidence of ovarian torsion. Moderate pelvic fluid with layering debris/ hemorrhage. Overall appearance favors recent hemorrhagic cyst rupture. Prior hysterectomy. Electronically Signed   By: Julian Hy M.D.   On: 07/13/2015 21:26   I have personally reviewed and evaluated  these images and lab results as part of my medical decision-making.  MDM   Final diagnoses:  Ovarian mass, right   Patient is a 44 year old female with a history of HIV and anemia who presents at the request of her PCP for concern for pancreatitis patient had a CT today that showed a large right adnexal mass. Lipase today is within normal limits. Patient's pain is in the right lower quadrant. 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 Gallbladder also appeared normal on scan so doubt this is due to her hepatobiliary system. Patient complains of persistent pain despite taking scheduled Tylenol at home for the last several days. Given the size of the right adnexal mass, we'll obtain pelvic percent to rule out ovarian torsion. She given IM Toradol for pain.   [place] negative for torsion - good venous and arterial flow. Possible clot in hemorrhagic cyst. Pain improved. Instructed to keep OB appt in 2 days. She reports understanding and is agreeable with plan. Stable for d/c.  Discussed with Dr. Thomasene Lot.   Gustavus Bryant, MD 07/13/15 2238  [place], MD 07/13/15 [id number]  Macarthur Critchley, MD 07/13/15 2330

## 2015-07-13 NOTE — ED Notes (Signed)
Pt was seen here on 11/24 for chest pain. Has been to pcp, sent here today due to elevated lipase and abd pain. Had outpatient ct scan and diagnosed with cyst. No acute distress noted at triage.

## 2015-07-13 NOTE — ED Notes (Signed)
Pt states that during the [place] the pain was relieved by the pressure of the probe on her abdomen. Pt states post [place], pain has returned only on right lower abdomen.

## 2015-08-06 ENCOUNTER — Ambulatory Visit
Admission: RE | Admit: 2015-08-06 | Discharge: 2015-08-06 | Disposition: A | Discharge status: Home / Self Care | Payer: Managed Care, Other (non HMO) | Source: Ambulatory Visit

## 2015-08-06 DIAGNOSIS — Z1231 Encounter for screening mammogram for malignant neoplasm of breast: Secondary | ICD-10-CM

## 2015-08-13 ENCOUNTER — Other Ambulatory Visit: Payer: Managed Care, Other (non HMO)

## 2015-08-13 DIAGNOSIS — B20 Human immunodeficiency virus [HIV] disease: Secondary | ICD-10-CM

## 2015-08-13 LAB — COMPREHENSIVE METABOLIC PANEL
ALK PHOS: 73 U/L (ref 33–115)
ALT: 15 U/L (ref 6–29)
AST: 13 U/L (ref 10–35)
Albumin: 4.2 g/dL (ref 3.6–5.1)
BILIRUBIN TOTAL: 0.4 mg/dL (ref 0.2–1.2)
BUN: 14 mg/dL (ref 7–25)
CALCIUM: 9.2 mg/dL (ref 8.6–10.2)
CO2: 25 mmol/L (ref 20–31)
Chloride: 103 mmol/L (ref 98–110)
Creat: 0.95 mg/dL (ref 0.50–1.10)
GLUCOSE: 84 mg/dL (ref 65–99)
Potassium: 4.2 mmol/L (ref 3.5–5.3)
Sodium: 136 mmol/L (ref 135–146)
TOTAL PROTEIN: 7.7 g/dL (ref 6.1–8.1)

## 2015-08-13 LAB — LIPID PANEL
CHOLESTEROL: 256 mg/dL — AB (ref 125–200)
HDL: 77 mg/dL (ref >=46)
LDL Cholesterol: 167 mg/dL — ABNORMAL HIGH (ref <130)
Total CHOL/HDL Ratio: 3.3 Ratio (ref <=5.0)
Triglycerides: 61 mg/dL (ref <150)
VLDL: 12 mg/dL (ref <30)

## 2015-08-14 LAB — T-HELPER CELL (CD4) - (RCID CLINIC ONLY)
CD4 % Helper T Cell: 40 % (ref 33–55)
CD4 T CELL ABS: 890 /uL (ref 400–2700)

## 2015-08-17 LAB — HIV-1 RNA QUANT-NO REFLEX-BLD: HIV 1 RNA Quant: <20 copies/mL (ref <20)

## 2015-08-20 ENCOUNTER — Other Ambulatory Visit: Payer: Self-pay | Admitting: Gastroenterology

## 2015-09-02 ENCOUNTER — Ambulatory Visit: Payer: Managed Care, Other (non HMO) | Admitting: *Deleted

## 2015-09-02 ENCOUNTER — Ambulatory Visit (INDEPENDENT_AMBULATORY_CARE_PROVIDER_SITE_OTHER): Payer: Managed Care, Other (non HMO) | Admitting: Internal Medicine

## 2015-09-02 VITALS — BP 126/84 | HR 79 | Temp 98.2°F | Wt 186.0 lb

## 2015-09-02 DIAGNOSIS — B20 Human immunodeficiency virus [HIV] disease: Secondary | ICD-10-CM

## 2015-09-02 DIAGNOSIS — F329 Major depressive disorder, single episode, unspecified: Secondary | ICD-10-CM | POA: Diagnosis not present

## 2015-09-02 DIAGNOSIS — B9681 Helicobacter pylori [H. pylori] as the cause of diseases classified elsewhere: Secondary | ICD-10-CM

## 2015-09-02 DIAGNOSIS — F32A Depression, unspecified: Secondary | ICD-10-CM

## 2015-09-02 DIAGNOSIS — F411 Generalized anxiety disorder: Secondary | ICD-10-CM

## 2015-09-02 DIAGNOSIS — Z23 Encounter for immunization: Secondary | ICD-10-CM | POA: Diagnosis not present

## 2015-09-02 DIAGNOSIS — A048 Other specified bacterial intestinal infections: Secondary | ICD-10-CM | POA: Insufficient documentation

## 2015-09-02 DIAGNOSIS — K219 Gastro-esophageal reflux disease without esophagitis: Secondary | ICD-10-CM | POA: Diagnosis not present

## 2015-09-02 NOTE — Progress Notes (Signed)
Patient Active Problem List   Diagnosis Date Noted  . Human immunodeficiency virus (HIV) disease ([place]) 01/22/2008    Priority: High  . Dyslipidemia 07/22/2013    Priority: Medium  . Obesity 07/22/2013    Priority: Medium  . GERD (gastroesophageal reflux disease) 09/02/2015  . H. pylori infection 09/02/2015  . ANXIETY 06/24/2010  . ALLERGIC RHINITIS 02/02/2010  . 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 Depression 02/05/2008  . TUBAL PREGNANCY 02/05/2008  . 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   ASPIRIN-ACETAMINOPHEN-CAFFEINE (EXCEDRIN MIGRAINE) 250-250-65 MG TABLET    Take 2 tablets by mouth every 6 (six) hours as needed for headache.   CHOLECALCIFEROL (VITAMIN D PO)    Take 1 tablet by mouth daily.   ELVITEGRAVIR-COBICISTAT-EMTRICITABINE-TENOFOVIR (GENVOYA) 150-150-200-10 MG TABS TABLET    Take 1 tablet by mouth daily with breakfast.   FERROUS SULFATE 325 (65 FE) MG TABLET    Take 325 mg by mouth daily with breakfast.   PANTOPRAZOLE (PROTONIX) 20 MG TABLET    Take 1 tablet (20 mg total) by mouth daily.   SUCRALFATE (CARAFATE) 1 GM/10ML SUSPENSION    Take 10 mLs (1 g total) by mouth 4 (four) times daily -  with meals and at bedtime.   SULFAMETHOXAZOLE-TRIMETHOPRIM (BACTRIM DS,SEPTRA DS) 800-160 MG TABLET    Take 1 tablet by mouth 2 (two) times daily.  Modified Medications   No medications on file  Discontinued Medications   No medications on file    Subjective: Marly is in for her routine HIV follow-up visit. She has done well with her recent switch from [place] to [place]. She has no problems tolerating it and has not been missing doses. She continues to struggle with work. 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  She is worried that she might be fired. He recently started having headaches again related to the stress. He tells me that she used to have migraine headaches. She also recently developed some chest pain and was seen in the emergency department. She was concerned that she was having a heart attack. She was told that she had acid reflux and was started on a proton pump inhibitor. She underwent endoscopy was recently found to have H. pylori infection and was started on Pylera.   Review of Systems: Review of Systems  Constitutional: Positive for malaise/fatigue. Negative for fever, chills, weight loss and diaphoresis.  HENT: Negative for sore throat.   Respiratory: Negative for cough, sputum production and shortness of breath.   Cardiovascular: Positive for chest pain.  Gastrointestinal: Positive for heartburn and abdominal pain. Negative for nausea, vomiting and diarrhea.  Genitourinary: Negative for dysuria and frequency.  Musculoskeletal: Negative for myalgias and joint pain.  Skin: Negative for rash.  Neurological: Positive for headaches. Negative for focal weakness.  Psychiatric/Behavioral: Positive for depression. Negative for substance abuse. The patient is nervous/anxious.     Past Medical History  Diagnosis Date  . HIV (human immunodeficiency virus infection) ([place])   . Headache(784.0)   . Anemia     Social History  Substance Use Topics  . Smoking status: Never Smoker   . Smokeless tobacco: Never Used  . Alcohol  Use: No    No family history on file.  No Known Allergies  Objective:  Filed Vitals:   09/02/15 1525  BP: 126/84  Pulse: 79  Temp: 98.2 F (36.8 C)  TempSrc: Oral  Weight: 186 lb (84.369 kg)   Body mass index is 34.01 kg/(m^2).  Physical Exam  Constitutional: She is oriented to person, place, and time.  She is tearful throughout exam.  HENT:  Mouth/Throat: No oropharyngeal exudate.  Eyes: Conjunctivae are normal.  Cardiovascular: Normal rate and  regular rhythm.   No murmur heard. Pulmonary/Chest: Breath sounds normal. She has no wheezes. She has no rales.  Abdominal: Soft. There is no tenderness.  Musculoskeletal: Normal range of motion.  Neurological: She is alert and oriented to person, place, and time.  Skin: No rash noted.    Lab Results Lab Results  Component Value Date   WBC 12.9* 07/13/2015   HGB 12.1 07/13/2015   HCT 34.5* 07/13/2015   MCV 79.3 07/13/2015   PLT 159 07/13/2015    Lab Results  Component Value Date   CREATININE 0.95 08/13/2015   BUN 14 08/13/2015   NA 136 08/13/2015   K 4.2 08/13/2015   CL 103 08/13/2015   CO2 25 08/13/2015    Lab Results  Component Value Date   ALT 15 08/13/2015   AST 13 08/13/2015   ALKPHOS 73 08/13/2015   BILITOT 0.4 08/13/2015    Lab Results  Component Value Date   CHOL 256* 08/13/2015   HDL 77 08/13/2015   LDLCALC 167* 08/13/2015   TRIG 61 08/13/2015   CHOLHDL 3.3 08/13/2015    Lab Results HIV 1 RNA QUANT (copies/mL)  Date Value  08/13/2015 <20  11/12/2014 <20  05/15/2014 <20   CD4 T CELL ABS (/uL)  Date Value  08/13/2015 890  11/12/2014 830  05/15/2014 740      Problem List Items Addressed This Visit      High   Human immunodeficiency virus (HIV) disease ([place])    Her HIV infection remains under excellent control. Because of her other recent problems she had been worried that her HIV infection had gone out of control. She felt better upon seeing her normal CD4 count and undetectable viral load. I will continue Genvoya.      Relevant Orders   T-helper cell (CD4)- ([facility] clinic only)   HIV 1 RNA quant-no reflex-bld   CBC   Comprehensive metabolic panel   Lipid panel   RPR     Unprioritized   Depression - Primary    She is under a great deal of stress and her depression is getting worse. She denies any suicidal ideations. I did introduce her to our mental health counselor, Rolena Infante, and she met with her today.      GERD (gastroesophageal  reflux disease)   H. pylori infection    She has H. pylori infection and symptomatic reflux but is already feeling better. I encouraged her to stay on her proton pump inhibitor and complete her course of Pylera.           Michel Bickers, MD [facility] [place] [facility] [phone] pager   [phone] cell 09/02/2015, 5:27 PM

## 2015-09-02 NOTE — Assessment & Plan Note (Signed)
Her HIV infection remains under excellent control. Because of her other recent problems she had been worried that her HIV infection had gone out of control. She felt better upon seeing her normal CD4 count and undetectable viral load. I will continue Genvoya.

## 2015-09-02 NOTE — Assessment & Plan Note (Signed)
She is under a great deal of stress and her depression is getting worse. She denies any suicidal ideations. I did introduce her to our mental health counselor, Rolena Infante, and she met with her today.

## 2015-09-02 NOTE — BH Specialist Note (Signed)
Counselor met with patient in the exam room after she reported feeling really down and needing to talk to someone about it.  Patient was oriented times four with good affect and dress.  Patient was alert and talkative.  Patient indicated that she has been having a difficult time at her present job due to mismanagement and chronic chaos.  Patient shared that she feels like her job is making her physically sick.  Counselor reviewed several options with patient that she could do to resolve this issue.  Counselor encouraged patient to meet regularly for counseling in order to process what all is going on in her life and how it is affecting her.  Patient made another appointment for next week.   Rolena Infante, MA [facility]

## 2015-09-02 NOTE — Assessment & Plan Note (Signed)
She has H. pylori infection and symptomatic reflux but is already feeling better. I encouraged her to stay on her proton pump inhibitor and complete her course of Pylera.

## 2015-11-14 ENCOUNTER — Other Ambulatory Visit: Payer: Self-pay | Admitting: Internal Medicine

## 2015-11-14 DIAGNOSIS — B20 Human immunodeficiency virus [HIV] disease: Secondary | ICD-10-CM

## 2015-11-16 ENCOUNTER — Other Ambulatory Visit: Payer: Self-pay | Admitting: *Deleted

## 2015-11-16 DIAGNOSIS — B20 Human immunodeficiency virus [HIV] disease: Secondary | ICD-10-CM

## 2015-11-16 MED ORDER — ELVITEG-COBIC-EMTRICIT-TENOFAF 150-150-200-10 MG PO TABS: 1.0000 | ORAL_TABLET | Freq: Every day | ORAL | Status: DC

## 2015-11-24 ENCOUNTER — Other Ambulatory Visit: Payer: Self-pay | Admitting: Internal Medicine

## 2016-02-23 ENCOUNTER — Other Ambulatory Visit: Payer: Managed Care, Other (non HMO)

## 2016-02-23 DIAGNOSIS — B20 Human immunodeficiency virus [HIV] disease: Secondary | ICD-10-CM

## 2016-02-24 LAB — CBC
HEMATOCRIT: 37.1 % (ref 35.0–45.0)
HEMOGLOBIN: 12.5 g/dL (ref 11.7–15.5)
MCH: 27.5 pg (ref 27.0–33.0)
MCHC: 33.7 g/dL (ref 32.0–36.0)
MCV: 81.5 fL (ref 80.0–100.0)
MPV: 11.5 fL (ref 7.5–12.5)
Platelets: 141 10*3/uL (ref 140–400)
RBC: 4.55 MIL/uL (ref 3.80–5.10)
RDW: 14.4 % (ref 11.0–15.0)
WBC: 8.8 10*3/uL (ref 3.8–10.8)

## 2016-02-24 LAB — COMPREHENSIVE METABOLIC PANEL
ALBUMIN: 4.1 g/dL (ref 3.6–5.1)
ALK PHOS: 74 U/L (ref 33–115)
ALT: 12 U/L (ref 6–29)
AST: 13 U/L (ref 10–35)
BILIRUBIN TOTAL: 0.4 mg/dL (ref 0.2–1.2)
BUN: 14 mg/dL (ref 7–25)
CALCIUM: 9.3 mg/dL (ref 8.6–10.2)
CO2: 25 mmol/L (ref 20–31)
Chloride: 101 mmol/L (ref 98–110)
Creat: 1.02 mg/dL (ref 0.50–1.10)
GLUCOSE: 81 mg/dL (ref 65–99)
POTASSIUM: 4 mmol/L (ref 3.5–5.3)
Sodium: 135 mmol/L (ref 135–146)
TOTAL PROTEIN: 7.4 g/dL (ref 6.1–8.1)

## 2016-02-24 LAB — LIPID PANEL
Cholesterol: 266 mg/dL — ABNORMAL HIGH (ref 125–200)
HDL: 80 mg/dL (ref >=46)
LDL CALC: 171 mg/dL — AB (ref <130)
Total CHOL/HDL Ratio: 3.3 Ratio (ref <=5.0)
Triglycerides: 76 mg/dL (ref <150)
VLDL: 15 mg/dL (ref <30)

## 2016-02-24 LAB — RPR

## 2016-02-25 LAB — T-HELPER CELL (CD4) - (RCID CLINIC ONLY)
CD4 T CELL ABS: 890 /uL (ref 400–2700)
CD4 T CELL HELPER: 41 % (ref 33–55)

## 2016-02-25 LAB — HIV-1 RNA QUANT-NO REFLEX-BLD
HIV 1 RNA Quant: <20 copies/mL (ref <20)
HIV-1 RNA Quant, Log: <1.3 Log copies/mL (ref <1.30)

## 2016-03-08 ENCOUNTER — Ambulatory Visit (INDEPENDENT_AMBULATORY_CARE_PROVIDER_SITE_OTHER): Payer: Managed Care, Other (non HMO) | Admitting: Internal Medicine

## 2016-03-08 DIAGNOSIS — L819 Disorder of pigmentation, unspecified: Secondary | ICD-10-CM | POA: Insufficient documentation

## 2016-03-08 DIAGNOSIS — K219 Gastro-esophageal reflux disease without esophagitis: Secondary | ICD-10-CM | POA: Diagnosis not present

## 2016-03-08 DIAGNOSIS — A048 Other specified bacterial intestinal infections: Secondary | ICD-10-CM

## 2016-03-08 DIAGNOSIS — B9681 Helicobacter pylori [H. pylori] as the cause of diseases classified elsewhere: Secondary | ICD-10-CM | POA: Diagnosis not present

## 2016-03-08 DIAGNOSIS — F32A Depression, unspecified: Secondary | ICD-10-CM

## 2016-03-08 DIAGNOSIS — F329 Major depressive disorder, single episode, unspecified: Secondary | ICD-10-CM | POA: Diagnosis not present

## 2016-03-08 DIAGNOSIS — B20 Human immunodeficiency virus [HIV] disease: Secondary | ICD-10-CM

## 2016-03-08 DIAGNOSIS — E669 Obesity, unspecified: Secondary | ICD-10-CM

## 2016-03-08 NOTE — Assessment & Plan Note (Deleted)
Patient reports completing a 10-day course of Pylera.  Her symptoms have improved and only requires PPI medication 1-2x/week.  Additionally, she reports her diet has been modified to include more fiber and health foods.

## 2016-03-08 NOTE — Assessment & Plan Note (Signed)
As per patient work related stress has improved.  Similarly, her mood has improved.  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  Other coping mechanisms include her faith (reading bible).  Office counselor, Leveda Anna, was offered to patient; however, patient refused.  She states she will contact Leveda Anna if needed.

## 2016-03-08 NOTE — Assessment & Plan Note (Addendum)
Patient reports an area of hyperpigmentation, which has changed in size of there course of 4 years.  About 0.5 cm in diameter with ashen centrally with dark, irregular, and raised borders.  Patient referred to dermatology for assessment/diagnosis.

## 2016-03-08 NOTE — Assessment & Plan Note (Signed)
Patient's HIV remains under excellent control.  She continues to be adherent to ART regimen.  Patient is concerned that Genvoya could be contributing to weight gain.  Other one-pill regimens were discussed.  Unfortunately, Vernell Leep would not be appropriate related to patient's use of PPIs.  Triumeq's size discouraged patient from switching.  Therefore, patient decided to remain on Genvoya for time being.  Patient to follow-up in 6 months with labs prior.

## 2016-03-08 NOTE — Assessment & Plan Note (Addendum)
Patient reports completing a 10-day course of Pylera + PPI.  Her symptoms have improved and only requires PPI medication 1-2x/week.  Additionally, she reports her diet has been modified to include more fiber and healthier options.

## 2016-03-08 NOTE — Progress Notes (Signed)
Subjective:    Patient ID: Alyssa Wilson, female    DOB: 1971/05/22, 45 y.o.   MRN: [id number]  HPI [name] is a 44 year old female diagnosed with HIV.  She was previously on Atripla, which she tolerated well.  However, for modernization of ART she was transitioned to [place].  She denies any missed doses in the past 4 weeks.  She reports tolerating the medication well, but is concerned her weight gain could be attributed to [place].  Additionally, patient has a history of H. Pylori and depression related to work stress.  She reports having completed a 10-day course of Pylera + PPI.  Her symptoms have improved dramatically and has transitoined to taking PPI 1-2x/week.  Patient reports that work remains stressful, but when compared to January it is slightly more tolerable.  Patient presents to clinic for routine follow-up with concerns regarding weight gain and hyperpigmentation spot to L lower, medial thigh.  Lab Results  Component Value Date   CD4TABS 890 02/23/2016   CD4TABS 890 08/13/2015   CD4TABS 830 11/12/2014   Lab Results  Component Value Date   HIV1RNAQUANT <20 02/23/2016   Review of Systems  Constitutional: Negative for chills and fever.  HENT: Negative for mouth sores, sore throat and trouble swallowing.   Respiratory: Negative for cough, chest tightness and shortness of breath.   Cardiovascular: Negative for chest pain.  Gastrointestinal: Negative for abdominal pain, constipation, diarrhea and nausea.  Genitourinary: Negative for dysuria and vaginal discharge.  Skin: Positive for color change (hyperpigmentation L lower, medial thigh; reports size has increased ).  Neurological: Negative for light-headedness and headaches.      Objective:   Physical Exam  Constitutional: She is oriented to person, place, and time. She appears well-developed and well-nourished.  HENT:  Mouth/Throat: No oropharyngeal exudate.  Eyes: Pupils are equal, round, and reactive to  light.  Cardiovascular: Normal rate, regular rhythm and normal heart sounds.   No murmur heard. Pulmonary/Chest: Effort normal and breath sounds normal. No respiratory distress. She has no wheezes.  Abdominal: Soft. Bowel sounds are normal. She exhibits no distension. There is no tenderness. There is no rebound.  Lymphadenopathy:    She has no cervical adenopathy.  Neurological: She is alert and oriented to person, place, and time.  Skin: Skin is warm and dry.     Psychiatric: She has a normal mood and affect. Her behavior is normal. Judgment and thought content normal.      Assessment & Plan:  Human immunodeficiency virus (HIV) disease Patient's HIV remains under excellent control.  She continues to be adherent to ART regimen.  Patient is concerned that Genvoya could be contributing to weight gain.  Other one-pill regimens were discussed.  Unfortunately, Vernell Leep would not be appropriate related to patient's use of PPIs.  Triumeq's size discouraged patient from switching.  Therefore, patient decided to remain on Genvoya for time being.  Patient to follow-up in 6 months with labs prior.  Depression As per patient work related stress has improved.  Similarly, her mood has improved.  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  Other coping mechanisms include her faith (reading bible).  Office counselor, Leveda Anna, was offered to patient; however, patient refused.  She states she will contact Leveda Anna if needed.  H. pylori infection Patient reports completing a 10-day course of Pylera + PPI.  Her symptoms have improved and only requires PPI medication 1-2x/week.  Additionally, she reports her diet has been  modified to include more fiber and healthier options.  Obesity Patient reports lifestyle changes.  She reports exercising 30-minutes per day 5 times per week.  Additionally, she completes her "steps" (total of 15 minutes) while at work.  Her diet has been modified to include more  fruits, vegetables, and fiber.  Patient's weight today was 193 lbs (which is a 7 pound weight loss as per patient).  Pigmented skin lesion of uncertain nature Patient reports an area of hyperpigmentation, which has changed in size of there course of 4 years.  About 0.5 cm in diameter with ashen centrally with dark, irregular, and raised borders.  Patient referred to dermatology for assessment/diagnosis.   Addendum: I have seen and examined Alyssa Wilson with Harrold Donath and agree with his assessment and plans. Alyssa Wilson is feeling less stressed and depressed. Her acid indigestion symptoms are dramatically better after finishing treatment for H. Pylori. Her adherence is good but she is worried that her Jorje Guild is making it difficult for her to lose weight. I doubt that to be the case and encouraged her to continue taking it and work on lifestyle modification to lose weight. She will continue Genvoya and follow-up after lab work in 6 months.  Alyssa Bickers, MD [facility] for [facility] [phone] pager   [phone] cell 03/09/2016, 5:31 PM

## 2016-03-08 NOTE — Assessment & Plan Note (Signed)
Patient reports lifestyle changes.  She reports exercising 30-minutes per day 5 times per week.  Additionally, she completes her "steps" (total of 15 minutes) while at work.  Her diet has been modified to include more fruits, vegetables, and fiber.  Patient's weight today was 193 lbs (which is a 7 pound weight loss as per patient).

## 2016-07-22 ENCOUNTER — Other Ambulatory Visit: Payer: Self-pay | Admitting: Obstetrics and Gynecology

## 2016-07-22 DIAGNOSIS — Z1231 Encounter for screening mammogram for malignant neoplasm of breast: Secondary | ICD-10-CM

## 2016-08-04 ENCOUNTER — Ambulatory Visit: Payer: Managed Care, Other (non HMO)

## 2016-08-24 ENCOUNTER — Ambulatory Visit: Payer: Managed Care, Other (non HMO)

## 2016-09-01 ENCOUNTER — Ambulatory Visit
Admission: RE | Admit: 2016-09-01 | Discharge: 2016-09-01 | Disposition: A | Discharge status: Home / Self Care | Payer: Managed Care, Other (non HMO) | Source: Ambulatory Visit | Attending: Obstetrics and Gynecology | Admitting: Obstetrics and Gynecology

## 2016-09-01 DIAGNOSIS — Z1231 Encounter for screening mammogram for malignant neoplasm of breast: Secondary | ICD-10-CM

## 2016-09-24 IMAGING — CT CT ABD-PELV W/ CM
3 of 5 series · 13 of 36 positions shown, 19 images · IV contrast (READICAT/WATER & [ID] ISOVUE 300)
Comparison: CT abdomen pelvis 04/07/2012

CLINICAL DATA: Right lower quadrant pain.  Nausea

EXAM:
CT ABDOMEN AND PELVIS WITH CONTRAST
TECHNIQUE: Multidetector CT imaging of the abdomen and pelvis was performed
using the standard protocol following bolus administration of
intravenous contrast.
CONTRAST:  100mL ZXZKPF-NBB IOPAMIDOL (ZXZKPF-NBB) INJECTION 61%

[Series 3: abd/pelvis with · axial · 0.81mm/px · z∈[-362,-62]mm · 7 of 82 slices shown, 12 images]
[im 11/82  soft-tissue]
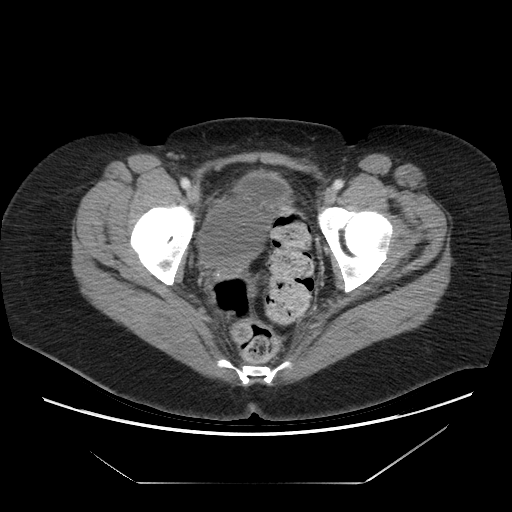
[im 11/82  bone]
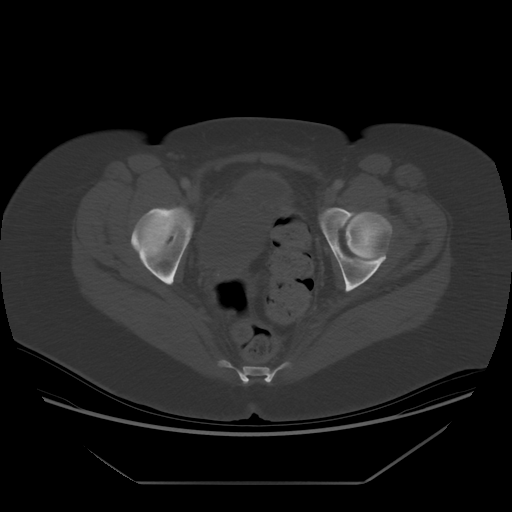
[im 21/82  soft-tissue]
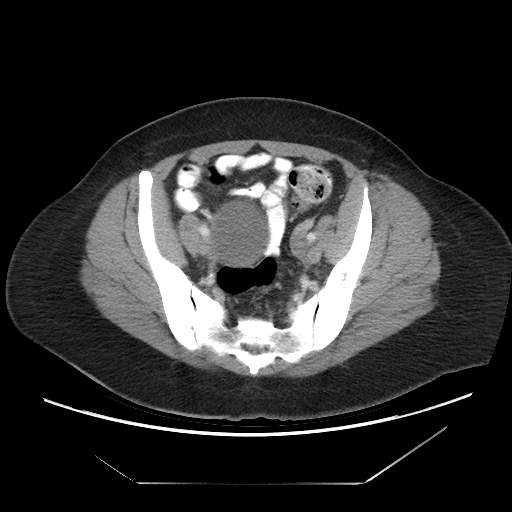
[im 31/82  soft-tissue]
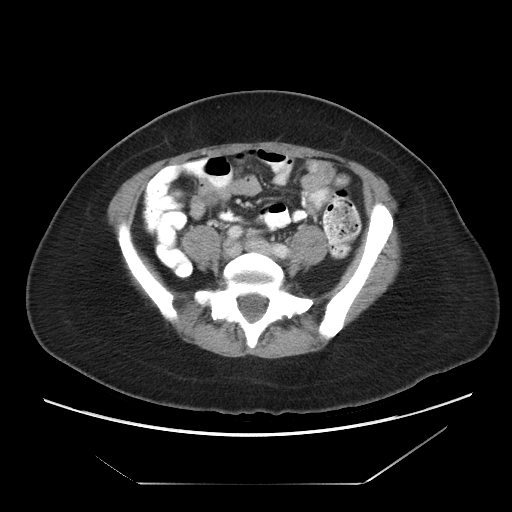
[im 41/82  soft-tissue]
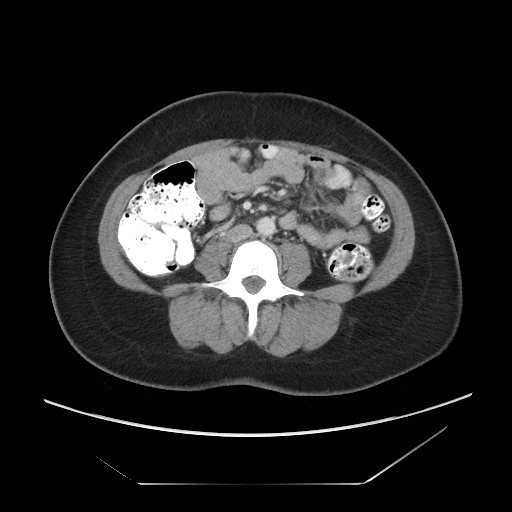
[im 41/82  lung]
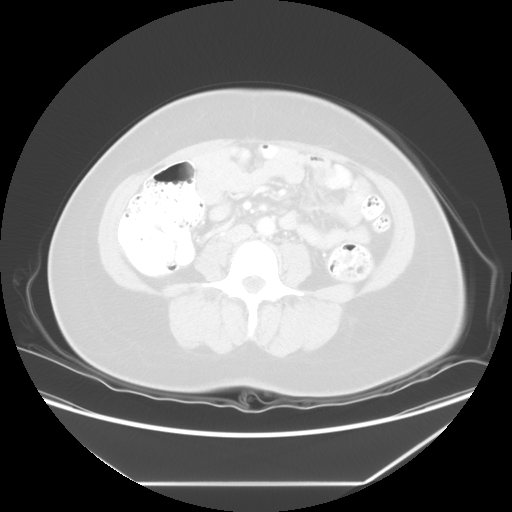
[im 51/82  soft-tissue]
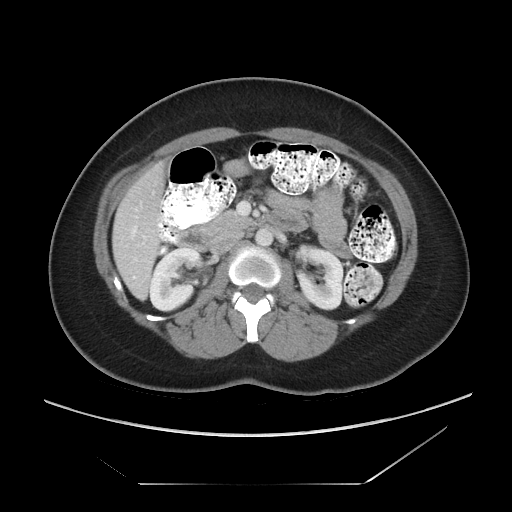
[im 51/82  lung]
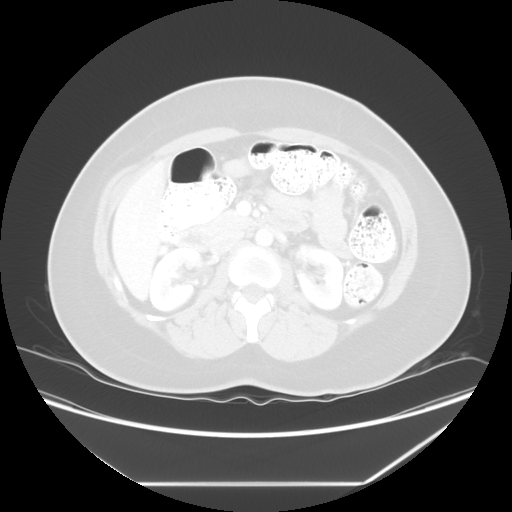
[im 61/82  soft-tissue]
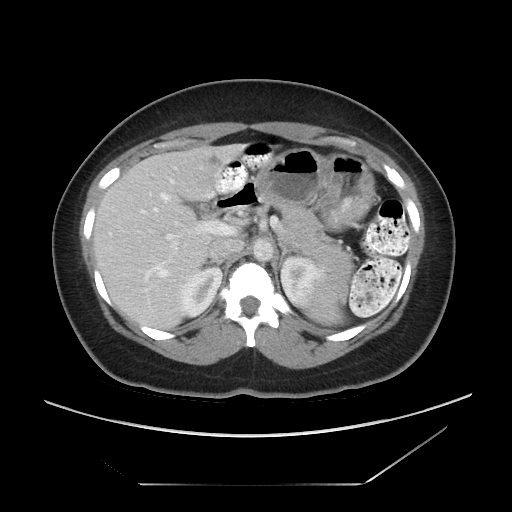
[im 61/82  lung]
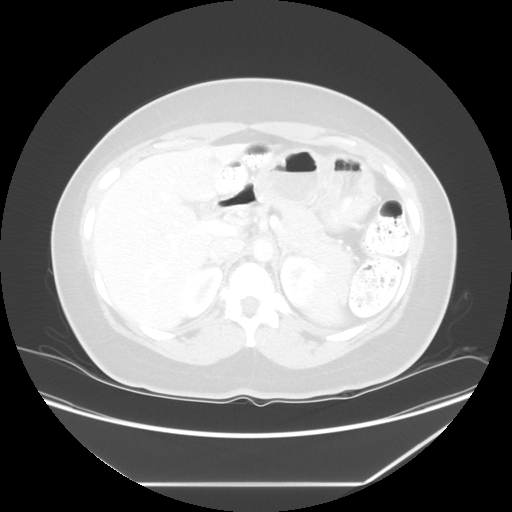
[im 71/82  soft-tissue]
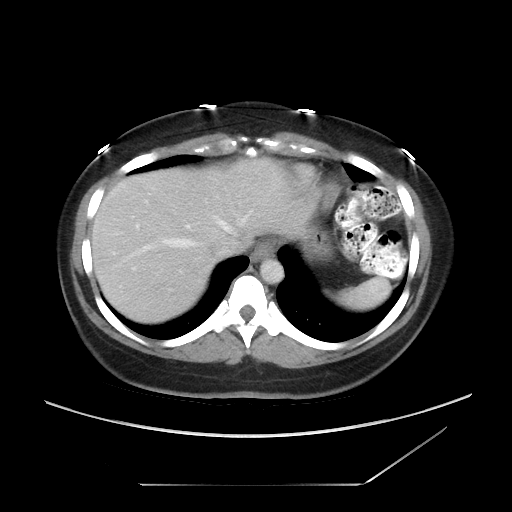
[im 71/82  lung]
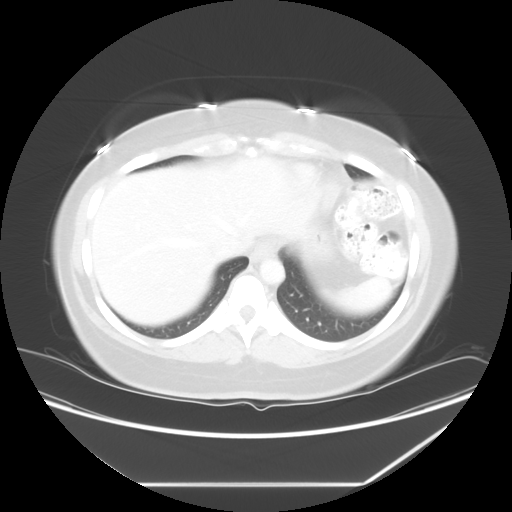

[Series 601: coronal body · coronal · 0.88mm/px · 1 of 118 slices shown, 2 images]
[im 40/118  soft-tissue]
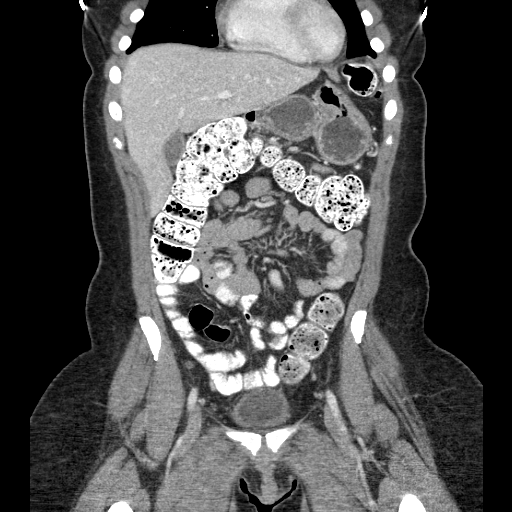
[im 40/118  bone]
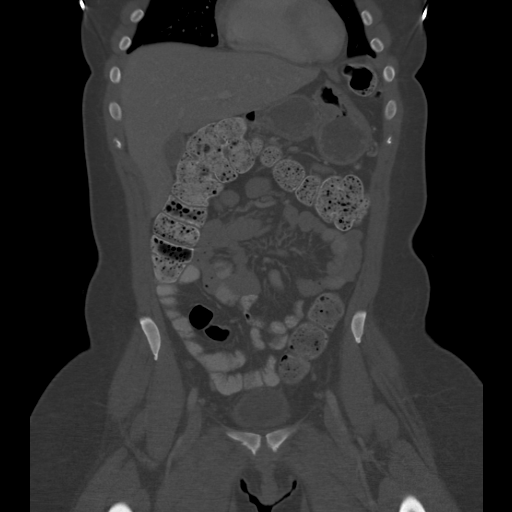

[Series 602: sagittal body · sagittal · 0.88mm/px · 5 of 168 slices shown]
[im 20/168  soft-tissue]
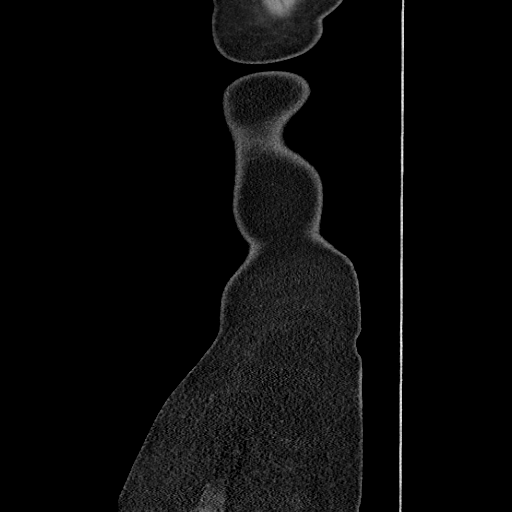
[im 40/168  soft-tissue]
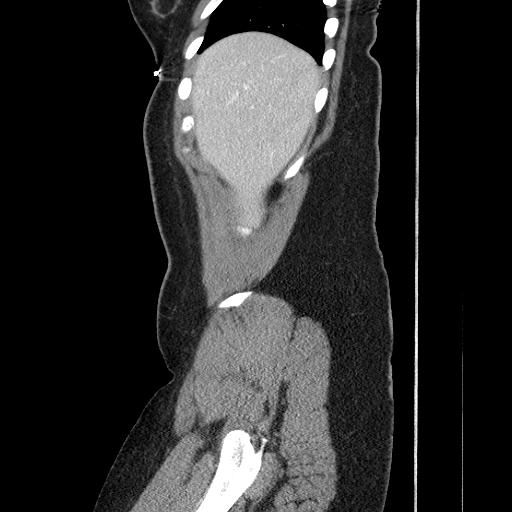
[im 59/168  soft-tissue]
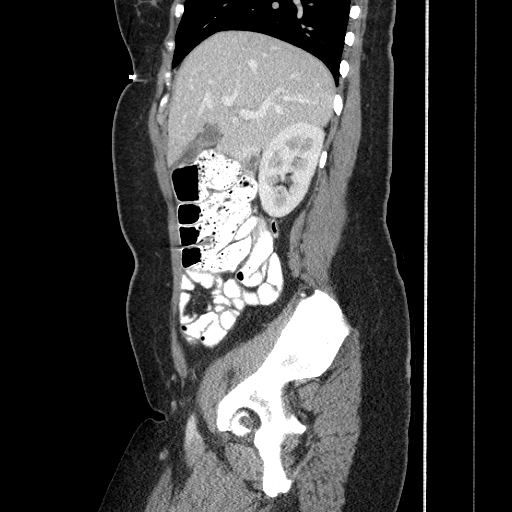
[im 79/168  soft-tissue]
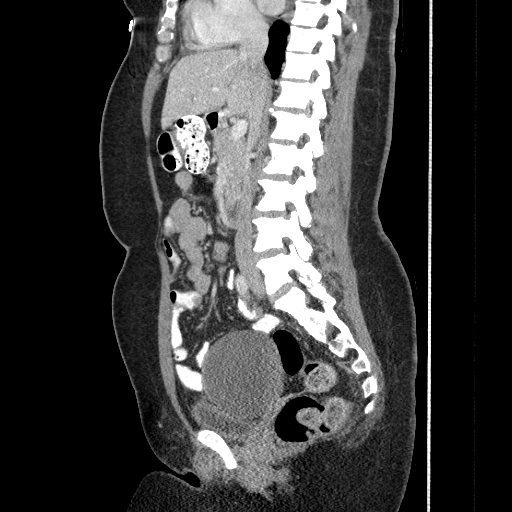
[im 99/168  soft-tissue]
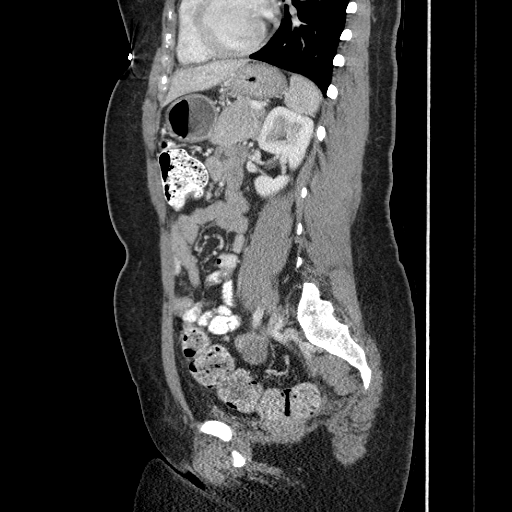

[13 of 36 positions shown; findings below may reference images not displayed]

FINDINGS: Lower chest:  Lung bases clear.  No infiltrate or effusion

Hepatobiliary: Normal liver. No mass or fatty infiltration.
Gallbladder and bile ducts normal. Portal vein patent.

Pancreas: Negative

Spleen: Negative

Adrenals/Urinary Tract: Negative for renal mass or obstruction. No
urinary tract calculi identified. Urinary bladder is partially
filled and normal

Stomach/Bowel: Moderate stool throughout the colon compatible with
constipation. Normal appendix. Retrocecal appendix. No bowel
thickening or mass lesion.

Vascular/Lymphatic: Early atherosclerotic disease in the aorta. No
aneurysm. No lymphadenopathy.

Reproductive: Large cyst in the right adnexum measuring 6.5 x 7.2 x
7.4 cm. This appears to have homogeneous water density and most
likely is a functional cyst however close follow-up is recommended
to exclude a cystic neoplasm of the ovary. Hysterectomy. No mass in
the left adnexum.

Other: No free fluid.

Musculoskeletal: Negative
IMPRESSION: Large cyst in the right adnexum most likely a functional cyst.
Follow-up ultrasound recommended in 6 weeks to exclude cystic
neoplasm.

Normal appendix

## 2016-10-28 ENCOUNTER — Other Ambulatory Visit: Payer: Self-pay | Admitting: *Deleted

## 2016-10-28 DIAGNOSIS — B20 Human immunodeficiency virus [HIV] disease: Secondary | ICD-10-CM

## 2016-10-28 MED ORDER — ELVITEG-COBIC-EMTRICIT-TENOFAF 150-150-200-10 MG PO TABS: 1.0000 | ORAL_TABLET | Freq: Every day | ORAL | 2 refills | Status: DC

## 2016-11-07 ENCOUNTER — Other Ambulatory Visit: Payer: Managed Care, Other (non HMO)

## 2016-11-07 DIAGNOSIS — B20 Human immunodeficiency virus [HIV] disease: Secondary | ICD-10-CM

## 2016-11-07 LAB — COMPREHENSIVE METABOLIC PANEL
ALBUMIN: 3.9 g/dL (ref 3.6–5.1)
ALT: 14 U/L (ref 6–29)
AST: 13 U/L (ref 10–35)
Alkaline Phosphatase: 82 U/L (ref 33–115)
BUN: 10 mg/dL (ref 7–25)
CALCIUM: 9.4 mg/dL (ref 8.6–10.2)
CO2: 25 mmol/L (ref 20–31)
CREATININE: 1.08 mg/dL (ref 0.50–1.10)
Chloride: 104 mmol/L (ref 98–110)
Glucose, Bld: 92 mg/dL (ref 65–99)
POTASSIUM: 4.2 mmol/L (ref 3.5–5.3)
SODIUM: 136 mmol/L (ref 135–146)
TOTAL PROTEIN: 7.4 g/dL (ref 6.1–8.1)
Total Bilirubin: 0.4 mg/dL (ref 0.2–1.2)

## 2016-11-07 LAB — CBC
HEMATOCRIT: 36.9 % (ref 35.0–45.0)
HEMOGLOBIN: 12.7 g/dL (ref 11.7–15.5)
MCH: 27.7 pg (ref 27.0–33.0)
MCHC: 34.4 g/dL (ref 32.0–36.0)
MCV: 80.6 fL (ref 80.0–100.0)
MPV: 10.7 fL (ref 7.5–12.5)
Platelets: 147 10*3/uL (ref 140–400)
RBC: 4.58 MIL/uL (ref 3.80–5.10)
RDW: 14.3 % (ref 11.0–15.0)
WBC: 7 10*3/uL (ref 3.8–10.8)

## 2016-11-07 LAB — LIPID PANEL
Cholesterol: 164 mg/dL (ref <200)
HDL: 63 mg/dL (ref >50)
LDL Cholesterol: 88 mg/dL (ref <100)
Total CHOL/HDL Ratio: 2.6 ratio (ref <5.0)
Triglycerides: 65 mg/dL (ref <150)
VLDL: 13 mg/dL (ref <30)

## 2016-11-08 LAB — T-HELPER CELL (CD4) - (RCID CLINIC ONLY)
CD4 % Helper T Cell: 43 % (ref 33–55)
CD4 T Cell Abs: 840 /uL (ref 400–2700)

## 2016-11-08 LAB — RPR

## 2016-11-09 LAB — HIV-1 RNA QUANT-NO REFLEX-BLD
HIV 1 RNA Quant: 59 copies/mL — ABNORMAL HIGH
HIV-1 RNA QUANT, LOG: 1.77 {Log_copies}/mL — AB

## 2016-11-21 ENCOUNTER — Encounter: Payer: Self-pay | Admitting: Internal Medicine

## 2016-11-21 ENCOUNTER — Other Ambulatory Visit (HOSPITAL_COMMUNITY)
Admission: RE | Admit: 2016-11-21 | Discharge: 2016-11-21 | Disposition: A | Discharge status: Home / Self Care | Payer: Managed Care, Other (non HMO) | Source: Ambulatory Visit | Attending: Obstetrics and Gynecology | Admitting: Obstetrics and Gynecology

## 2016-11-21 ENCOUNTER — Ambulatory Visit (INDEPENDENT_AMBULATORY_CARE_PROVIDER_SITE_OTHER): Payer: Managed Care, Other (non HMO) | Admitting: Internal Medicine

## 2016-11-21 ENCOUNTER — Other Ambulatory Visit: Payer: Self-pay | Admitting: Obstetrics and Gynecology

## 2016-11-21 VITALS — BP 118/85 | HR 99 | Temp 97.9°F | Ht 62.0 in | Wt 192.0 lb

## 2016-11-21 DIAGNOSIS — Z1151 Encounter for screening for human papillomavirus (HPV): Secondary | ICD-10-CM | POA: Diagnosis not present

## 2016-11-21 DIAGNOSIS — B20 Human immunodeficiency virus [HIV] disease: Secondary | ICD-10-CM | POA: Diagnosis not present

## 2016-11-21 DIAGNOSIS — Z23 Encounter for immunization: Secondary | ICD-10-CM

## 2016-11-21 DIAGNOSIS — E66812 Obesity, class 2: Secondary | ICD-10-CM

## 2016-11-21 DIAGNOSIS — Z01419 Encounter for gynecological examination (general) (routine) without abnormal findings: Secondary | ICD-10-CM | POA: Diagnosis present

## 2016-11-21 DIAGNOSIS — Z6835 Body mass index (BMI) 35.0-35.9, adult: Secondary | ICD-10-CM | POA: Diagnosis not present

## 2016-11-21 DIAGNOSIS — A048 Other specified bacterial intestinal infections: Secondary | ICD-10-CM | POA: Diagnosis not present

## 2016-11-21 NOTE — Assessment & Plan Note (Signed)
I congratulated her on her weight loss and encouraged her to continue her lifestyle modification.

## 2016-11-21 NOTE — Progress Notes (Signed)
Patient Active Problem List   Diagnosis Date Noted  . Human immunodeficiency virus (HIV) disease ([place]) 01/22/2008    Priority: High  . Dyslipidemia 07/22/2013    Priority: Medium  . Obesity 07/22/2013    Priority: Medium  . Pigmented skin lesion of uncertain nature 38/05/1750  . GERD (gastroesophageal reflux disease) 09/02/2015  . H. pylori infection 09/02/2015  . ANXIETY 06/24/2010  . ALLERGIC RHINITIS 02/02/2010  . 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 Depression 02/05/2008  . TUBAL PREGNANCY 02/05/2008  . 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   ATORVASTATIN (LIPITOR) 20 MG TABLET    Take 20 mg by mouth daily.   CHOLECALCIFEROL (VITAMIN D PO)    Take 1 tablet by mouth daily.   ELVITEGRAVIR-COBICISTAT-EMTRICITABINE-TENOFOVIR (GENVOYA) 150-150-200-10 MG TABS TABLET    Take 1 tablet by mouth daily with breakfast.   FERROUS SULFATE 325 (65 FE) MG TABLET    Take 325 mg by mouth daily with breakfast.   PANTOPRAZOLE (PROTONIX) 20 MG TABLET    Take 1 tablet (20 mg total) by mouth daily.  Modified Medications   No medications on file  Discontinued Medications   ASPIRIN 325 MG TABLET    Take 325 mg by mouth every 6 (six) hours as needed for moderate pain.   SUCRALFATE (CARAFATE) 1 GM/10ML SUSPENSION    Take 10 mLs (1 g total) by mouth 4 (four) times daily -  with meals and at bedtime.    Subjective: Alyssa Wilson is in for her routine follow-up visit. Alyssa Wilson recently started taking her Genvoya each morning with breakfast and states that Alyssa Wilson is feeling better. Alyssa Wilson sleeps better at night and has more energy during the day. Alyssa Wilson has increased her exercise and is happy to have lost about 10 pounds recently. Alyssa Wilson denies missing any doses  of her Genvoya. Alyssa Wilson is still having problems with her acid reflux.   Review of Systems: Review of Systems  Constitutional: Negative for chills, diaphoresis, fever, malaise/fatigue and weight loss.  HENT: Negative for sore throat.   Respiratory: Negative for cough, sputum production and shortness of breath.   Cardiovascular: Negative for chest pain.  Gastrointestinal: Positive for heartburn. Negative for abdominal pain, diarrhea, nausea and vomiting.  Genitourinary: Negative for dysuria and frequency.  Musculoskeletal: Negative for joint pain and myalgias.  Skin: Negative for rash.  Neurological: Negative for dizziness and headaches.  Psychiatric/Behavioral: Negative for depression and substance abuse. The patient is not nervous/anxious.     Past Medical History:  Diagnosis Date  . Anemia   . Headache(784.0)   . HIV (human immunodeficiency virus infection) ([place])     Social History  Substance Use Topics  . Smoking status: Never Smoker  . Smokeless tobacco: Never Used  . Alcohol use No    No family history on file.  No Known Allergies  Objective:  Vitals:   11/21/16 1550  BP: 118/85  Pulse: 99  Temp: 97.9 F (36.6 C)  TempSrc: Oral  Weight: 192 lb (87.1 kg)  Height: 5\' 2"  (1.575 m)   Body mass index is 35.12 kg/m.  Physical Exam  Constitutional: Alyssa Wilson is oriented to person, place, and time. No distress.  HENT:  Mouth/Throat: No oropharyngeal exudate.  Eyes: Conjunctivae are normal.  Cardiovascular: Normal rate and regular rhythm.   No murmur heard. Pulmonary/Chest: Effort normal and breath sounds normal.  Abdominal: Soft. Alyssa Wilson exhibits no mass. There is no tenderness.  Musculoskeletal: Normal range of motion.  Neurological: Alyssa Wilson is alert and oriented to person, place, and time.  Skin: No rash noted.  Psychiatric: Mood and affect normal.    Lab Results Lab Results  Component Value Date   WBC 7.0 11/07/2016   HGB 12.7 11/07/2016   HCT 36.9 11/07/2016   MCV  80.6 11/07/2016   PLT 147 11/07/2016    Lab Results  Component Value Date   CREATININE 1.08 11/07/2016   BUN 10 11/07/2016   NA 136 11/07/2016   K 4.2 11/07/2016   CL 104 11/07/2016   CO2 25 11/07/2016    Lab Results  Component Value Date   ALT 14 11/07/2016   AST 13 11/07/2016   ALKPHOS 82 11/07/2016   BILITOT 0.4 11/07/2016    Lab Results  Component Value Date   CHOL 164 11/07/2016   HDL 63 11/07/2016   LDLCALC 88 11/07/2016   TRIG 65 11/07/2016   CHOLHDL 2.6 11/07/2016   HIV 1 RNA Quant (copies/mL)  Date Value  11/07/2016 59 (H)  02/23/2016 <20  08/13/2015 <20   CD4 T Cell Abs (/uL)  Date Value  11/07/2016 840  02/23/2016 890  08/13/2015 890     Problem List Items Addressed This Visit      High   Human immunodeficiency virus (HIV) disease ([place])    Her CD4 count remains well up in the normal range. Alyssa Wilson has had some slight viral activation but overall her infection remains under very good control. Alyssa Wilson will continue Genvoya and follow-up after lab work in 6 months.      Relevant Orders   T-helper cell (CD4)- ([facility] clinic only)   HIV 1 RNA quant-no reflex-bld     Medium   Obesity    I congratulated her on her weight loss and encouraged her to continue her lifestyle modification.        Unprioritized   H. pylori infection    If Alyssa Wilson continues to be bothered with symptoms I will obtain follow-up testing for H. pylori after her recent treatment.           Michel Bickers, MD [facility] for [facility] [phone] pager   [phone] cell 11/21/2016, 4:22 PM

## 2016-11-21 NOTE — Assessment & Plan Note (Signed)
Her CD4 count remains well up in the normal range. She has had some slight viral activation but overall her infection remains under very good control. She will continue Genvoya and follow-up after lab work in 6 months.

## 2016-11-21 NOTE — Addendum Note (Signed)
Addended by: Myrtis Hopping A on: 11/21/2016 04:28 PM   Modules accepted: Orders

## 2016-11-21 NOTE — Assessment & Plan Note (Signed)
If she continues to be bothered with symptoms I will obtain follow-up testing for H. pylori after her recent treatment.

## 2016-11-24 LAB — CYTOLOGY - PAP
Diagnosis: NEGATIVE
HPV: NOT DETECTED

## 2017-06-12 ENCOUNTER — Encounter (INDEPENDENT_AMBULATORY_CARE_PROVIDER_SITE_OTHER): Payer: Self-pay | Admitting: Orthopaedic Surgery

## 2017-06-12 ENCOUNTER — Ambulatory Visit (INDEPENDENT_AMBULATORY_CARE_PROVIDER_SITE_OTHER): Payer: Managed Care, Other (non HMO)

## 2017-06-12 ENCOUNTER — Ambulatory Visit (INDEPENDENT_AMBULATORY_CARE_PROVIDER_SITE_OTHER): Payer: Managed Care, Other (non HMO) | Admitting: Orthopaedic Surgery

## 2017-06-12 DIAGNOSIS — M542 Cervicalgia: Secondary | ICD-10-CM

## 2017-06-12 DIAGNOSIS — M25511 Pain in right shoulder: Secondary | ICD-10-CM | POA: Diagnosis not present

## 2017-06-12 MED ORDER — METHYLPREDNISOLONE ACETATE 40 MG/ML IJ SUSP
40.0000 mg | INTRAMUSCULAR | Status: AC | PRN
  Administered 2017-06-12: 40 mg via INTRA_ARTICULAR

## 2017-06-12 MED ORDER — LIDOCAINE HCL 1 % IJ SOLN
3.0000 mL | INTRAMUSCULAR | Status: AC | PRN
Start: 2017-06-12 — End: 2017-06-12
  Administered 2017-06-12: 3 mL

## 2017-06-12 NOTE — Progress Notes (Signed)
Office Visit Note   Patient: Alyssa Wilson           Date of Birth: 1971/02/13           MRN: [id number] Visit Date: 06/12/2017              Requested by: Maurice Small, MD [address] [place], [place] [address] PCP: Maurice Small, MD   Assessment & Plan: Visit Diagnoses:  1. Acute pain of right shoulder   2. Neck pain     Plan: I do feel that she would benefit from a glenohumeral injection of the right shoulder as well as a trigger point injection in the trapezius area.  I explained the risks and benefits of this and our reasoning and understanding of how this could help her.  We had a long thorough discussion about this and she is agreeable to trying these injections.  She tolerated them well.  She will follow-up as needed but she knows as well and knows to come back and see [place] if we need to recommend physical therapy or further injections such as dry needling.  All questions and concerns were answered and addressed.  Patient is a very pleasant 46 year old female  Follow-Up Instructions: Return if symptoms worsen or fail to improve.   Orders:  Orders Placed This Encounter  Procedures  . Large Joint Inj  . XR Shoulder Right  . XR Cervical Spine 2 or 3 views   No orders of the defined types were placed in this encounter.     Procedures: Large Joint Inj: R subacromial bursa on 06/12/2017 5:00 PM Medications: 3 mL lidocaine 1 %; 40 mg methylPREDNISolone acetate 40 MG/ML      Clinical Data: No additional findings.   Subjective: Chief Complaint  Patient presents with  . Right Shoulder - Pain  With a right shoulder and neck pain for about 3 months now.  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  She says she started to lose her motion with reaching behind her.  She denies any injury to that shoulder denies any numbness and tingling  in her upper extremity.  She is left-handed.  She currently denies any headache, chest pain, shortness of breath, fever, chills, nausea, vomiting  HPI  Review of Systems   Objective: Vital Signs: LMP 03/17/2012   Physical Exam She is alert and oriented x3 and in no acute distress examination of the Ortho Exam Right shoulder shows limitations with internal rotation and adduction due to pain to only reaching behind her at the mid lumbar spine level when she can reach much higher on the other side.  Otherwise there is no functional deficit of the rotator cuff.  Her range of motion is otherwise full.  It is painful in the subacromial outlet.  But she does have trigger point pain I can reproduce this pain that does radiate to her cervical spine area. Specialty Comments:  No specialty comments available.  Imaging: Xr Cervical Spine 2 Or 3 Views  Result Date: 06/12/2017 2 views of the cervical spine show loss of lordosis on the lateral view but well-maintained disc height and otherwise  Xr Shoulder Right  Result Date: 06/12/2017 3 views of the right shoulder show well located shoulder with slight decrease of the subacromial outlet on the outlet view.  There is no  acute findings otherwise.    PMFS History: Patient Active Problem List   Diagnosis Date Noted  . Pigmented skin lesion of uncertain nature 53/61/4431  . GERD (gastroesophageal reflux disease) 09/02/2015  . H. pylori infection 09/02/2015  . Dyslipidemia 07/22/2013  . Obesity 07/22/2013  . ANXIETY 06/24/2010  . ALLERGIC RHINITIS 02/02/2010  . 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 Depression 02/05/2008  . TUBAL PREGNANCY 02/05/2008  . 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 Human immunodeficiency virus (HIV) disease ([place]) 01/22/2008   Past Medical History:  Diagnosis Date  . Anemia   . Headache(784.0)   . HIV (human immunodeficiency virus infection) ([place])     History reviewed. No pertinent family  history.  Past Surgical History:  Procedure Laterality Date  . ABDOMINAL HYSTERECTOMY    . CERVICAL CONIZATION W/BX    . LAPAROSCOPY ABDOMEN DIAGNOSTIC     ectopic pregnant  1998   Social History   Occupational History  . Not on file  Tobacco Use  . Smoking status: Never Smoker  . Smokeless tobacco: Never Used  Substance and Sexual Activity  . Alcohol use: No    Alcohol/week: 0.0 oz    Types: 1 drink(s) per week  . Drug use: No  . Sexual activity: Yes    Comment: declined condoms

## 2017-06-14 ENCOUNTER — Other Ambulatory Visit: Payer: 59

## 2017-06-14 DIAGNOSIS — B20 Human immunodeficiency virus [HIV] disease: Secondary | ICD-10-CM

## 2017-06-15 LAB — T-HELPER CELL (CD4) - (RCID CLINIC ONLY)
CD4 T CELL ABS: 430 /uL (ref 400–2700)
CD4 T CELL HELPER: 28 % — AB (ref 33–55)

## 2017-06-16 LAB — HIV-1 RNA QUANT-NO REFLEX-BLD
HIV 1 RNA QUANT: 38 {copies}/mL — AB
HIV-1 RNA Quant, Log: 1.58 Log copies/mL — ABNORMAL HIGH

## 2017-07-04 ENCOUNTER — Ambulatory Visit: Payer: Managed Care, Other (non HMO) | Admitting: Internal Medicine

## 2017-07-11 ENCOUNTER — Ambulatory Visit (INDEPENDENT_AMBULATORY_CARE_PROVIDER_SITE_OTHER): Payer: 59 | Admitting: Internal Medicine

## 2017-07-11 DIAGNOSIS — Z23 Encounter for immunization: Secondary | ICD-10-CM | POA: Diagnosis not present

## 2017-07-11 DIAGNOSIS — B20 Human immunodeficiency virus [HIV] disease: Secondary | ICD-10-CM

## 2017-07-11 DIAGNOSIS — F3340 Major depressive disorder, recurrent, in remission, unspecified: Secondary | ICD-10-CM

## 2017-07-11 DIAGNOSIS — E785 Hyperlipidemia, unspecified: Secondary | ICD-10-CM

## 2017-07-11 NOTE — Progress Notes (Signed)
Patient Active Problem List   Diagnosis Date Noted  . Human immunodeficiency virus (HIV) disease ([place]) 01/22/2008    Priority: High  . Dyslipidemia 07/22/2013    Priority: Medium  . Obesity 07/22/2013    Priority: Medium  . Pigmented skin lesion of uncertain nature 93/23/5573  . GERD (gastroesophageal reflux disease) 09/02/2015  . H. pylori infection 09/02/2015  . ANXIETY 06/24/2010  . ALLERGIC RHINITIS 02/02/2010  . 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 Depression 02/05/2008  . TUBAL PREGNANCY 02/05/2008  . PAP SMER CERV W/LW GRADE SQUAMOUS INTRAEPITH LES 02/05/2008      Medication List        Accurate as of 07/11/17  9:51 AM. Always use your most recent med list.          aspirin-acetaminophen-caffeine 250-250-65 MG tablet Commonly known as:  EXCEDRIN MIGRAINE   atorvastatin 20 MG tablet Commonly known as:  LIPITOR   elvitegravir-cobicistat-emtricitabine-tenofovir 150-150-200-10 MG Tabs tablet Commonly known as:  GENVOYA Take 1 tablet by mouth daily with breakfast.   ferrous sulfate 325 (65 FE) MG tablet   pantoprazole 20 MG tablet Commonly known as:  PROTONIX Take 1 tablet (20 mg total) by mouth daily.   simvastatin 20 MG tablet Commonly known as:  ZOCOR   VITAMIN D PO       Subjective: Alyssa Wilson is in for her routine HIV follow-up visit. As usual, she never misses a single dose of her Genvoya. She tolerates it well. She has joined [employer] and is happy to have lost 11 pounds. She is walking more. She is no longer feeling depressed.   Review of Systems: Review of Systems  Constitutional: Positive for weight loss. Negative for chills, diaphoresis, fever and malaise/fatigue.  HENT: Negative for sore throat.   Respiratory: Negative for cough, sputum production and shortness of breath.   Cardiovascular: Negative for chest pain.  Gastrointestinal: Negative for abdominal pain, diarrhea, heartburn, nausea and vomiting.  Genitourinary: Negative  for dysuria and frequency.  Musculoskeletal: Negative for joint pain and myalgias.  Skin: Negative for rash.  Neurological: Negative for dizziness and headaches.  Psychiatric/Behavioral: Negative for depression and substance abuse. The patient is not nervous/anxious.     Past Medical History:  Diagnosis Date  . Anemia   . Headache(784.0)   . HIV (human immunodeficiency virus infection) ([place])     Social History   Tobacco Use  . Smoking status: Never Smoker  . Smokeless tobacco: Never Used  Substance Use Topics  . Alcohol use: No    Alcohol/week: 0.0 oz    Types: 1 drink(s) per week  . Drug use: No    No family history on file.  No Known Allergies  Health Maintenance  Topic Date Due  . TETANUS/TDAP  08/08/1988  . INFLUENZA VACCINE  03/08/2017  . HIV Screening  Completed    Objective:  Vitals:   07/11/17 0934  Temp: (!) 97.5 F (36.4 C)  TempSrc: Oral  Weight: 190 lb (86.2 kg)   Body mass index is 34.75 kg/m.  Physical Exam  Constitutional: She is oriented to person, place, and time.  She is in very good spirits.  HENT:  Mouth/Throat: No oropharyngeal exudate.  Eyes: Conjunctivae are normal.  Cardiovascular: Normal rate and regular rhythm.  No murmur heard. Pulmonary/Chest: Effort normal and breath sounds normal.  Abdominal: Soft. She exhibits no mass. There is no tenderness.  Musculoskeletal: Normal range of motion.  Neurological: She is alert and oriented  to person, place, and time.  Skin: No rash noted.  Psychiatric: Mood and affect normal.    Lab Results Lab Results  Component Value Date   WBC 7.0 11/07/2016   HGB 12.7 11/07/2016   HCT 36.9 11/07/2016   MCV 80.6 11/07/2016   PLT 147 11/07/2016    Lab Results  Component Value Date   CREATININE 1.08 11/07/2016   BUN 10 11/07/2016   NA 136 11/07/2016   K 4.2 11/07/2016   CL 104 11/07/2016   CO2 25 11/07/2016    Lab Results  Component Value Date   ALT 14 11/07/2016   AST 13 11/07/2016    ALKPHOS 82 11/07/2016   BILITOT 0.4 11/07/2016    Lab Results  Component Value Date   CHOL 164 11/07/2016   HDL 63 11/07/2016   LDLCALC 88 11/07/2016   TRIG 65 11/07/2016   CHOLHDL 2.6 11/07/2016   Lab Results  Component Value Date   LABRPR NON REAC 11/07/2016   HIV 1 RNA Quant (copies/mL)  Date Value  06/14/2017 38 (H)  11/07/2016 59 (H)  02/23/2016 <20   CD4 T Cell Abs (/uL)  Date Value  06/14/2017 430  11/07/2016 840  02/23/2016 890     Problem List Items Addressed This Visit      High   Human immunodeficiency virus (HIV) disease ([place])    She is concerned that her last 2 viral loads have been undetectable and that her CD4 count is down to 430. I tried to reassure her that this is expected to see minor variations in her numbers. She will continue Genvoya and follow-up after lab work in 3 months. She received her influenza vaccination today.      Relevant Orders   T-helper cell (CD4)- ([facility] clinic only)   HIV 1 RNA quant-no reflex-bld   CBC   Comprehensive metabolic panel   Lipid panel   RPR     Medium   Dyslipidemia    Her lipids are all at goal.      Relevant Medications   simvastatin (ZOCOR) 20 MG tablet     Unprioritized   Depression    Her depression is in remission.           Michel Bickers, MD [facility] for [facility] [phone] pager   [phone] cell 07/11/2017, 9:51 AM

## 2017-07-11 NOTE — Assessment & Plan Note (Signed)
Her depression is in remission. 

## 2017-07-11 NOTE — Assessment & Plan Note (Signed)
She is concerned that her last 2 viral loads have been undetectable and that her CD4 count is down to 430. I tried to reassure her that this is expected to see minor variations in her numbers. She will continue Genvoya and follow-up after lab work in 3 months. She received her influenza vaccination today.

## 2017-07-11 NOTE — Assessment & Plan Note (Signed)
Her lipids are all at goal.

## 2017-07-19 ENCOUNTER — Other Ambulatory Visit: Payer: Self-pay | Admitting: Internal Medicine

## 2017-07-19 DIAGNOSIS — B20 Human immunodeficiency virus [HIV] disease: Secondary | ICD-10-CM

## 2017-08-17 ENCOUNTER — Other Ambulatory Visit: Payer: Self-pay | Admitting: Family Medicine

## 2017-08-17 DIAGNOSIS — Z1239 Encounter for other screening for malignant neoplasm of breast: Secondary | ICD-10-CM

## 2017-09-08 ENCOUNTER — Ambulatory Visit
Admission: RE | Admit: 2017-09-08 | Discharge: 2017-09-08 | Disposition: A | Discharge status: Home / Self Care | Payer: 59 | Source: Ambulatory Visit | Attending: Family Medicine | Admitting: Family Medicine

## 2017-09-08 DIAGNOSIS — Z1239 Encounter for other screening for malignant neoplasm of breast: Secondary | ICD-10-CM

## 2017-09-11 ENCOUNTER — Other Ambulatory Visit: Payer: Self-pay | Admitting: Family Medicine

## 2017-09-11 DIAGNOSIS — R928 Other abnormal and inconclusive findings on diagnostic imaging of breast: Secondary | ICD-10-CM

## 2017-09-13 ENCOUNTER — Ambulatory Visit
Admission: RE | Admit: 2017-09-13 | Discharge: 2017-09-13 | Disposition: A | Discharge status: Home / Self Care | Payer: 59 | Source: Ambulatory Visit | Attending: Family Medicine | Admitting: Family Medicine

## 2017-09-13 DIAGNOSIS — R928 Other abnormal and inconclusive findings on diagnostic imaging of breast: Secondary | ICD-10-CM

## 2017-09-26 ENCOUNTER — Other Ambulatory Visit: Payer: 59

## 2017-10-10 ENCOUNTER — Ambulatory Visit: Payer: 59 | Admitting: Internal Medicine

## 2017-12-26 ENCOUNTER — Other Ambulatory Visit: Payer: Self-pay | Admitting: Internal Medicine

## 2017-12-26 DIAGNOSIS — B20 Human immunodeficiency virus [HIV] disease: Secondary | ICD-10-CM

## 2018-01-05 ENCOUNTER — Other Ambulatory Visit: Payer: Self-pay | Admitting: Gastroenterology

## 2018-01-05 DIAGNOSIS — R109 Unspecified abdominal pain: Secondary | ICD-10-CM

## 2018-01-17 ENCOUNTER — Ambulatory Visit
Admission: RE | Admit: 2018-01-17 | Discharge: 2018-01-17 | Disposition: A | Discharge status: Home / Self Care | Payer: 59 | Source: Ambulatory Visit | Attending: Gastroenterology | Admitting: Gastroenterology

## 2018-01-17 DIAGNOSIS — R109 Unspecified abdominal pain: Secondary | ICD-10-CM

## 2018-01-17 MED ORDER — IOPAMIDOL (ISOVUE-300) INJECTION 61%
100.0000 mL | Freq: Once | INTRAVENOUS | Status: AC | PRN
  Administered 2018-01-17: 100 mL via INTRAVENOUS

## 2018-01-18 ENCOUNTER — Other Ambulatory Visit: Payer: Self-pay | Admitting: Internal Medicine

## 2018-01-18 DIAGNOSIS — B20 Human immunodeficiency virus [HIV] disease: Secondary | ICD-10-CM

## 2018-02-19 ENCOUNTER — Other Ambulatory Visit: Payer: 59

## 2018-02-21 ENCOUNTER — Other Ambulatory Visit: Payer: Self-pay | Admitting: Internal Medicine

## 2018-02-21 ENCOUNTER — Encounter: Payer: Self-pay | Admitting: Internal Medicine

## 2018-02-21 ENCOUNTER — Telehealth: Payer: Self-pay

## 2018-02-21 DIAGNOSIS — B20 Human immunodeficiency virus [HIV] disease: Secondary | ICD-10-CM

## 2018-02-21 NOTE — Telephone Encounter (Signed)
Called pt today regarding message about coming in for lab today. Left a vm for pt to call back to scheduled an appt with our front desk. [place]

## 2018-02-23 ENCOUNTER — Other Ambulatory Visit: Payer: 59

## 2018-03-05 ENCOUNTER — Encounter: Payer: Self-pay | Admitting: Internal Medicine

## 2018-03-05 ENCOUNTER — Ambulatory Visit (INDEPENDENT_AMBULATORY_CARE_PROVIDER_SITE_OTHER): Payer: 59 | Admitting: Internal Medicine

## 2018-03-05 VITALS — BP 122/81 | HR 87 | Temp 97.6°F | Wt 200.0 lb

## 2018-03-05 DIAGNOSIS — B20 Human immunodeficiency virus [HIV] disease: Secondary | ICD-10-CM

## 2018-03-05 DIAGNOSIS — Z23 Encounter for immunization: Secondary | ICD-10-CM | POA: Diagnosis not present

## 2018-03-05 NOTE — Addendum Note (Signed)
Addended by: Aundria Rud on: 03/05/2018 04:10 PM   Modules accepted: Orders

## 2018-03-05 NOTE — Progress Notes (Signed)
Patient Active Problem List   Diagnosis Date Noted  . Human immunodeficiency virus (HIV) disease ([place]) 01/22/2008    Priority: High  . Dyslipidemia 07/22/2013    Priority: Medium  . Obesity 07/22/2013    Priority: Medium  . Pigmented skin lesion of uncertain nature 54/62/7035  . GERD (gastroesophageal reflux disease) 09/02/2015  . H. pylori infection 09/02/2015  . ANXIETY 06/24/2010  . ALLERGIC RHINITIS 02/02/2010  . 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 Depression 02/05/2008  . TUBAL PREGNANCY 02/05/2008  . 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   ATORVASTATIN (LIPITOR) 20 MG TABLET    Take 20 mg by mouth daily.   CHOLECALCIFEROL (VITAMIN D PO)    Take 1 tablet by mouth daily.   DICYCLOMINE (BENTYL) 20 MG TABLET    TAKE 1 TABLET BY MOUTH 3 TIMES DAILY BEFORE MEALS.   FERROUS SULFATE 325 (65 FE) MG TABLET    Take 325 mg by mouth daily with breakfast.   GENVOYA 150-150-200-10 MG TABS TABLET    TAKE 1 TABLET BY MOUTH DAILY WITH BREAKFAST. PLEASE CALL OFFICE FOR APPOINTMENT   PANTOPRAZOLE (PROTONIX) 20 MG TABLET    Take 1 tablet (20 mg total) by mouth daily.   SIMVASTATIN (ZOCOR) 20 MG TABLET      Modified Medications   No medications on file  Discontinued Medications   No medications on file    Subjective: Alyssa Wilson is in for her routine HIV follow-up visit.  She has had no problems obtaining, taking or tolerating her Genvoya and has not missed doses.  She takes it each morning with breakfast.  She was recently diagnosed as having irritable bowel syndrome.  She was started on dicyclomine and feels much better.  She is having much less bloating and cramping.  Review of Systems: Review of Systems  Constitutional:  Negative for chills, diaphoresis, fever and weight loss.  Gastrointestinal: Positive for abdominal pain. Negative for diarrhea, nausea and vomiting.  Psychiatric/Behavioral: Negative for depression.    Past Medical History:  Diagnosis Date  . Anemia   . Headache(784.0)   . HIV (human immunodeficiency virus infection) ([place])     Social History   Tobacco Use  . Smoking status: Never Smoker  . Smokeless tobacco: Never Used  Substance Use Topics  . Alcohol use: No    Alcohol/week: 0.3 oz    Types: 1 drink(s) per week  . Drug use: No    No family history on file.  No Known Allergies  Health Maintenance  Topic Date Due  . TETANUS/TDAP  08/08/1988  . INFLUENZA VACCINE  03/08/2018  . HIV Screening  Completed    Objective:  Vitals:   03/05/18 1519  BP: 122/81  Pulse: 87  Temp: 97.6 F (36.4 C)  TempSrc: Oral  Weight: 200 lb (90.7 kg)   Body mass index is 36.58 kg/m.  Physical Exam  Constitutional: She is oriented to person, place, and time.  She is in good spirits as usual.  HENT:  Mouth/Throat: No oropharyngeal exudate.  Eyes: Conjunctivae are normal.  Cardiovascular: Normal rate and regular rhythm.  No murmur heard. Pulmonary/Chest: Effort normal and breath sounds normal.  Abdominal: Soft. She exhibits no mass. There is  no tenderness.  Musculoskeletal: Normal range of motion.  Neurological: She is alert and oriented to person, place, and time.  Skin: No rash noted.  Psychiatric: She has a normal mood and affect.    Lab Results Lab Results  Component Value Date   WBC 7.0 11/07/2016   HGB 12.7 11/07/2016   HCT 36.9 11/07/2016   MCV 80.6 11/07/2016   PLT 147 11/07/2016    Lab Results  Component Value Date   CREATININE 1.08 11/07/2016   BUN 10 11/07/2016   NA 136 11/07/2016   K 4.2 11/07/2016   CL 104 11/07/2016   CO2 25 11/07/2016    Lab Results  Component Value Date   ALT 14 11/07/2016   AST 13 11/07/2016   ALKPHOS 82 11/07/2016   BILITOT  0.4 11/07/2016    Lab Results  Component Value Date   CHOL 164 11/07/2016   HDL 63 11/07/2016   LDLCALC 88 11/07/2016   TRIG 65 11/07/2016   CHOLHDL 2.6 11/07/2016   Lab Results  Component Value Date   LABRPR NON REAC 11/07/2016   HIV 1 RNA Quant (copies/mL)  Date Value  06/14/2017 38 (H)  11/07/2016 59 (H)  02/23/2016 <20   CD4 T Cell Abs (/uL)  Date Value  06/14/2017 430  11/07/2016 840  02/23/2016 890     Problem List Items Addressed This Visit      High   Human immunodeficiency virus (HIV) disease ([place])    Her infection has been under good long-term control.  Her adherence is excellent.  She will continue Genvoya and get lab work today.  She will follow-up in 6 months.      Relevant Orders   T-helper cell (CD4)- ([facility] clinic only)   HIV 1 RNA quant-no reflex-bld   CBC   Comprehensive metabolic panel   RPR   Lipid panel        Michel Bickers, MD [facility] for [facility] [place] [phone] pager   [phone] cell 03/05/2018, 3:47 PM

## 2018-03-05 NOTE — Assessment & Plan Note (Signed)
Her infection has been under good long-term control.  Her adherence is excellent.  She will continue Genvoya and get lab work today.  She will follow-up in 6 months.

## 2018-03-06 LAB — CBC
HCT: 36.9 % (ref 35.0–45.0)
Hemoglobin: 12.7 g/dL (ref 11.7–15.5)
MCH: 27.3 pg (ref 27.0–33.0)
MCHC: 34.4 g/dL (ref 32.0–36.0)
MCV: 79.4 fL — ABNORMAL LOW (ref 80.0–100.0)
MPV: 11.9 fL (ref 7.5–12.5)
PLATELETS: 131 10*3/uL — AB (ref 140–400)
RBC: 4.65 10*6/uL (ref 3.80–5.10)
RDW: 14.4 % (ref 11.0–15.0)
WBC: 7.2 10*3/uL (ref 3.8–10.8)

## 2018-03-06 LAB — COMPREHENSIVE METABOLIC PANEL
AG Ratio: 1.5 (calc) (ref 1.0–2.5)
ALBUMIN MSPROF: 4.5 g/dL (ref 3.6–5.1)
ALT: 22 U/L (ref 6–29)
AST: 21 U/L (ref 10–35)
Alkaline phosphatase (APISO): 94 U/L (ref 33–115)
BUN / CREAT RATIO: 17 (calc) (ref 6–22)
BUN: 19 mg/dL (ref 7–25)
CHLORIDE: 106 mmol/L (ref 98–110)
CO2: 26 mmol/L (ref 20–32)
CREATININE: 1.12 mg/dL — AB (ref 0.50–1.10)
Calcium: 9.6 mg/dL (ref 8.6–10.2)
GLOBULIN: 3.1 g/dL (ref 1.9–3.7)
GLUCOSE: 89 mg/dL (ref 65–99)
POTASSIUM: 4.4 mmol/L (ref 3.5–5.3)
SODIUM: 142 mmol/L (ref 135–146)
TOTAL PROTEIN: 7.6 g/dL (ref 6.1–8.1)
Total Bilirubin: 0.3 mg/dL (ref 0.2–1.2)

## 2018-03-06 LAB — T-HELPER CELL (CD4) - (RCID CLINIC ONLY)
CD4 % Helper T Cell: 38 % (ref 33–55)
CD4 T CELL ABS: 880 /uL (ref 400–2700)

## 2018-03-06 LAB — LIPID PANEL
CHOL/HDL RATIO: 2.8 (calc) (ref <5.0)
Cholesterol: 188 mg/dL (ref <200)
HDL: 66 mg/dL (ref >50)
LDL CHOLESTEROL (CALC): 104 mg/dL — AB
NON-HDL CHOLESTEROL (CALC): 122 mg/dL (ref <130)
Triglycerides: 86 mg/dL (ref <150)

## 2018-03-06 LAB — RPR: RPR Ser Ql: NONREACTIVE

## 2018-03-07 LAB — HIV-1 RNA QUANT-NO REFLEX-BLD
HIV 1 RNA Quant: <20 copies/mL
HIV-1 RNA Quant, Log: <1.3 Log copies/mL

## 2018-03-20 ENCOUNTER — Other Ambulatory Visit: Payer: Self-pay | Admitting: Internal Medicine

## 2018-03-20 DIAGNOSIS — B20 Human immunodeficiency virus [HIV] disease: Secondary | ICD-10-CM

## 2018-06-14 ENCOUNTER — Encounter (INDEPENDENT_AMBULATORY_CARE_PROVIDER_SITE_OTHER): Payer: Self-pay | Admitting: Orthopaedic Surgery

## 2018-06-14 ENCOUNTER — Ambulatory Visit (INDEPENDENT_AMBULATORY_CARE_PROVIDER_SITE_OTHER): Payer: 59 | Admitting: Orthopaedic Surgery

## 2018-06-14 ENCOUNTER — Ambulatory Visit (INDEPENDENT_AMBULATORY_CARE_PROVIDER_SITE_OTHER): Payer: 59

## 2018-06-14 DIAGNOSIS — M79604 Pain in right leg: Secondary | ICD-10-CM

## 2018-06-14 NOTE — Progress Notes (Signed)
Office Visit Note   Patient: Alyssa Wilson           Date of Birth: Sep 08, 1970           MRN: [id number] Visit Date: 06/14/2018              Requested by: Maurice Small, MD [address] [place], [place] [address] PCP: Maurice Small, MD   Assessment & Plan: Visit Diagnoses:  1. Pain in right leg     Plan: Given the mild to moderate arthritis in her right knee from 6 years ago an MRI and given the fact now she is having worsening locking catching we are concerned about a worsening meniscal tear the possibility of meniscal tear.  We will put her in a hinged knee brace today for support and we need to obtain an MRI to assess the ligamentous structures of the knee given her continued falls from the knee giving out on her locking catching.  We will see her back after the MRI is obtained to go over with her.  All question concerns were answered and addressed.  Follow-Up Instructions: Return in about 2 weeks (around 06/28/2018).   Orders:  Orders Placed This Encounter  Procedures  . XR Knee 1-2 Views Right   No orders of the defined types were placed in this encounter.     Procedures: No procedures performed   Clinical Data: No additional findings.   Subjective: Chief Complaint  Patient presents with  . Right Knee - Pain  Patient is a very pleasant 47 year-old female that I seen before.  She comes in today after having another fall due to her right knee giving way.  This knee is giving way before is been slowly getting worse.  She has a large hematoma on her shin and bruising after another recent fall with the knee gave out on her.  She does report some locking catching with that right knee.  We did obtain an MRI 6 years ago of her right knee after similar episodes and it showed no meniscal tear but thinning of the articular cartilage in the medial compartment of her knee.  This is been slowly getting worse.  She denies any issues with her left knee.  She denies any  problems with her back or her hips.  The knee does swell on occasion.  She points the medial side as source of her pain.  Again she says she can be walking long and the knee locks on her and gives way and she falls.  HPI  Review of Systems She currently denies any headache, chest pain, shortness of breath, fever, chills, nausea, vomiting.  Objective: Vital Signs: LMP 03/17/2012   Physical Exam She is alert and oriented x3 and in no acute distress Ortho Exam Examination of her right knee does show bruising along the medial knee as well as the medial shin.  There is a slight effusion in the visualized both knees together.  Both knees hyperextend and have patellofemoral crepitation.  She does have a positive Murray sign the medial compartment of her right knee. Specialty Comments:  No specialty comments available.  Imaging: Xr Knee 1-2 Views Right  Result Date: 06/14/2018 2 views of the right knee show no acute findings.  Medial joint space is slightly narrowed.    PMFS History: Patient Active Problem List   Diagnosis Date Noted  . Pigmented skin lesion of uncertain nature 42/35/3614  . GERD (gastroesophageal reflux disease) 09/02/2015  .  H. pylori infection 09/02/2015  . Dyslipidemia 07/22/2013  . Obesity 07/22/2013  . ANXIETY 06/24/2010  . ALLERGIC RHINITIS 02/02/2010  . 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 Depression 02/05/2008  . TUBAL PREGNANCY 02/05/2008  . 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 Human immunodeficiency virus (HIV) disease ([place]) 01/22/2008   Past Medical History:  Diagnosis Date  . Anemia   . Headache(784.0)   . HIV (human immunodeficiency virus infection) ([place])     History reviewed. No pertinent family history.  Past Surgical History:  Procedure Laterality Date  . ABDOMINAL HYSTERECTOMY    . CERVICAL CONIZATION W/BX    . LAPAROSCOPY ABDOMEN DIAGNOSTIC     ectopic pregnant  1998   Social History   Occupational History  .  Not on file  Tobacco Use  . Smoking status: Never Smoker  . Smokeless tobacco: Never Used  Substance and Sexual Activity  . Alcohol use: No    Alcohol/week: 0.5 standard drinks    Types: 1 drink(s) per week  . Drug use: No  . Sexual activity: Yes    Comment: declined condoms

## 2018-06-15 ENCOUNTER — Other Ambulatory Visit (INDEPENDENT_AMBULATORY_CARE_PROVIDER_SITE_OTHER): Payer: Self-pay

## 2018-06-15 DIAGNOSIS — M25561 Pain in right knee: Secondary | ICD-10-CM

## 2018-06-25 ENCOUNTER — Ambulatory Visit
Admission: RE | Admit: 2018-06-25 | Discharge: 2018-06-25 | Disposition: A | Discharge status: Home / Self Care | Payer: 59 | Source: Ambulatory Visit | Attending: Orthopaedic Surgery | Admitting: Orthopaedic Surgery

## 2018-06-25 DIAGNOSIS — M25561 Pain in right knee: Secondary | ICD-10-CM

## 2018-06-28 ENCOUNTER — Encounter (INDEPENDENT_AMBULATORY_CARE_PROVIDER_SITE_OTHER): Payer: Self-pay | Admitting: Physician Assistant

## 2018-06-28 ENCOUNTER — Ambulatory Visit (INDEPENDENT_AMBULATORY_CARE_PROVIDER_SITE_OTHER): Payer: 59 | Admitting: Physician Assistant

## 2018-06-28 DIAGNOSIS — M1711 Unilateral primary osteoarthritis, right knee: Secondary | ICD-10-CM | POA: Diagnosis not present

## 2018-06-28 NOTE — Progress Notes (Signed)
HPI: [name] returns today for follow-up of her right knee status post MRI.  She continues to have pain typically medial aspect of the knee.  States she has difficulty going up and down steps.  Knee does give way on her periodically.  She takes occasional NSAID and also uses natural anti-inflammatories.  MRI right knee dated 06/25/2018: MRI is compared to right knee MRI 03/08/2012 and shows progressive degeneration of the medial femoral condyle.  Patellofemoral and lateral joints well preserved.  No meniscal tear.  MRI images were reviewed with the patient.  Physical exam: Right knee: Full range of motion.  Tenderness along medial joint line no instability valgus varus stressing.  Patellofemoral crepitus.  Impression: Right knee medial compartment arthritis  Plan discussed with her conservative treatment which includes quad strengthening and knee friendly exercises.  She would like to go to physical therapy to work on quad strengthening get a good home exercise program.  We gave her a prescription for physical therapy to work on quad strengthening, home exercise program and modalities.  She also may benefit from cortisone injection in the knee in the future and/or supplemental injection in the future.  We will see her back in 6 weeks check her progress lack of.  Questions encouraged and answered

## 2018-08-09 ENCOUNTER — Ambulatory Visit (INDEPENDENT_AMBULATORY_CARE_PROVIDER_SITE_OTHER): Payer: 59 | Admitting: Physician Assistant

## 2018-08-16 ENCOUNTER — Ambulatory Visit (INDEPENDENT_AMBULATORY_CARE_PROVIDER_SITE_OTHER): Payer: 59 | Admitting: Physician Assistant

## 2018-08-17 ENCOUNTER — Other Ambulatory Visit: Payer: Self-pay | Admitting: Internal Medicine

## 2018-08-17 DIAGNOSIS — B20 Human immunodeficiency virus [HIV] disease: Secondary | ICD-10-CM

## 2018-08-27 ENCOUNTER — Other Ambulatory Visit: Payer: Self-pay | Admitting: Behavioral Health

## 2018-08-27 ENCOUNTER — Other Ambulatory Visit: Payer: Self-pay | Admitting: Internal Medicine

## 2018-08-27 DIAGNOSIS — B20 Human immunodeficiency virus [HIV] disease: Secondary | ICD-10-CM

## 2018-08-27 MED ORDER — ELVITEG-COBIC-EMTRICIT-TENOFAF 150-150-200-10 MG PO TABS: ORAL_TABLET | ORAL | 1 refills | Status: DC

## 2018-09-20 ENCOUNTER — Other Ambulatory Visit: Payer: Self-pay

## 2018-09-20 DIAGNOSIS — Z113 Encounter for screening for infections with a predominantly sexual mode of transmission: Secondary | ICD-10-CM

## 2018-09-20 DIAGNOSIS — B20 Human immunodeficiency virus [HIV] disease: Secondary | ICD-10-CM

## 2018-09-21 ENCOUNTER — Other Ambulatory Visit: Payer: Self-pay | Admitting: Family Medicine

## 2018-09-21 DIAGNOSIS — Z1231 Encounter for screening mammogram for malignant neoplasm of breast: Secondary | ICD-10-CM

## 2018-09-24 ENCOUNTER — Other Ambulatory Visit: Payer: 59

## 2018-09-24 DIAGNOSIS — Z113 Encounter for screening for infections with a predominantly sexual mode of transmission: Secondary | ICD-10-CM

## 2018-09-24 DIAGNOSIS — B20 Human immunodeficiency virus [HIV] disease: Secondary | ICD-10-CM

## 2018-09-25 LAB — T-HELPER CELLS (CD4) COUNT (NOT AT ARMC)
CD4 % Helper T Cell: 40 % (ref 33–55)
CD4 T CELL ABS: 890 /uL (ref 400–2700)

## 2018-09-26 LAB — COMPREHENSIVE METABOLIC PANEL
AG Ratio: 1.2 (calc) (ref 1.0–2.5)
ALBUMIN MSPROF: 4.2 g/dL (ref 3.6–5.1)
ALKALINE PHOSPHATASE (APISO): 95 U/L (ref 31–125)
ALT: 17 U/L (ref 6–29)
AST: 16 U/L (ref 10–35)
BILIRUBIN TOTAL: 0.4 mg/dL (ref 0.2–1.2)
BUN/Creatinine Ratio: 15 (calc) (ref 6–22)
BUN: 21 mg/dL (ref 7–25)
CALCIUM: 9.3 mg/dL (ref 8.6–10.2)
CO2: 26 mmol/L (ref 20–32)
Chloride: 104 mmol/L (ref 98–110)
Creat: 1.37 mg/dL — ABNORMAL HIGH (ref 0.50–1.10)
GLOBULIN: 3.6 g/dL (ref 1.9–3.7)
Glucose, Bld: 90 mg/dL (ref 65–99)
POTASSIUM: 4 mmol/L (ref 3.5–5.3)
Sodium: 137 mmol/L (ref 135–146)
TOTAL PROTEIN: 7.8 g/dL (ref 6.1–8.1)

## 2018-09-26 LAB — CBC WITH DIFFERENTIAL/PLATELET
Absolute Monocytes: 543 cells/uL (ref 200–950)
BASOS ABS: 40 {cells}/uL (ref 0–200)
Basophils Relative: 0.6 %
Eosinophils Absolute: 60 cells/uL (ref 15–500)
Eosinophils Relative: 0.9 %
HCT: 38.6 % (ref 35.0–45.0)
HEMOGLOBIN: 13 g/dL (ref 11.7–15.5)
LYMPHS ABS: 2291 {cells}/uL (ref 850–3900)
MCH: 26.9 pg — ABNORMAL LOW (ref 27.0–33.0)
MCHC: 33.7 g/dL (ref 32.0–36.0)
MCV: 79.9 fL — AB (ref 80.0–100.0)
MPV: 12.4 fL (ref 7.5–12.5)
Monocytes Relative: 8.1 %
Neutro Abs: 3765 cells/uL (ref 1500–7800)
Neutrophils Relative %: 56.2 %
PLATELETS: 135 10*3/uL — AB (ref 140–400)
RBC: 4.83 10*6/uL (ref 3.80–5.10)
RDW: 14.3 % (ref 11.0–15.0)
Total Lymphocyte: 34.2 %
WBC: 6.7 10*3/uL (ref 3.8–10.8)

## 2018-09-26 LAB — RPR: RPR: NONREACTIVE

## 2018-09-26 LAB — HIV-1 RNA QUANT-NO REFLEX-BLD
HIV 1 RNA QUANT: NOT DETECTED {copies}/mL
HIV-1 RNA Quant, Log: <1.3 Log copies/mL

## 2018-10-08 ENCOUNTER — Ambulatory Visit (INDEPENDENT_AMBULATORY_CARE_PROVIDER_SITE_OTHER): Payer: 59 | Admitting: Internal Medicine

## 2018-10-08 ENCOUNTER — Encounter: Payer: Self-pay | Admitting: Internal Medicine

## 2018-10-08 ENCOUNTER — Other Ambulatory Visit: Payer: Self-pay

## 2018-10-08 VITALS — BP 123/82 | HR 101 | Ht 62.0 in | Wt 204.0 lb

## 2018-10-08 DIAGNOSIS — F325 Major depressive disorder, single episode, in full remission: Secondary | ICD-10-CM | POA: Diagnosis not present

## 2018-10-08 DIAGNOSIS — Z23 Encounter for immunization: Secondary | ICD-10-CM

## 2018-10-08 DIAGNOSIS — E785 Hyperlipidemia, unspecified: Secondary | ICD-10-CM

## 2018-10-08 DIAGNOSIS — M1711 Unilateral primary osteoarthritis, right knee: Secondary | ICD-10-CM

## 2018-10-08 DIAGNOSIS — F3342 Major depressive disorder, recurrent, in full remission: Secondary | ICD-10-CM

## 2018-10-08 DIAGNOSIS — Z6837 Body mass index (BMI) 37.0-37.9, adult: Secondary | ICD-10-CM

## 2018-10-08 DIAGNOSIS — B20 Human immunodeficiency virus [HIV] disease: Secondary | ICD-10-CM | POA: Diagnosis not present

## 2018-10-08 DIAGNOSIS — Z79899 Other long term (current) drug therapy: Secondary | ICD-10-CM

## 2018-10-08 DIAGNOSIS — Z6835 Body mass index (BMI) 35.0-35.9, adult: Secondary | ICD-10-CM

## 2018-10-08 DIAGNOSIS — E669 Obesity, unspecified: Secondary | ICD-10-CM

## 2018-10-08 NOTE — Addendum Note (Signed)
Addended by: Eugenia Mcalpine on: 10/08/2018 02:05 PM   Modules accepted: Orders

## 2018-10-08 NOTE — Assessment & Plan Note (Signed)
Her depression is in remission. 

## 2018-10-08 NOTE — Assessment & Plan Note (Signed)
Her infection remains under excellent, long-term control.  She received her influenza and meningococcal vaccine today.  She will follow-up after lab work in 1 year.  She will continue Genvoya.

## 2018-10-08 NOTE — Assessment & Plan Note (Signed)
She will continue simvastatin and have repeat lab work at the time of her next visit.

## 2018-10-08 NOTE — Assessment & Plan Note (Signed)
I encouraged her to try to increase her activity level and perhaps go back on the same diet she used successfully last year with [employer].

## 2018-10-08 NOTE — Progress Notes (Signed)
LPN administered Flu and Menveo #2 this visit. Verbal orders received per Dr. Megan Salon. Patient tolerated well.  Eugenia Mcalpine, LPN

## 2018-10-08 NOTE — Progress Notes (Signed)
Patient Active Problem List   Diagnosis Date Noted  . Human immunodeficiency virus (HIV) disease ([place]) 01/22/2008    Priority: High  . Dyslipidemia 07/22/2013    Priority: Medium  . Obesity 07/22/2013    Priority: Medium  . Pigmented skin lesion of uncertain nature 84/16/6063  . GERD (gastroesophageal reflux disease) 09/02/2015  . H. pylori infection 09/02/2015  . ANXIETY 06/24/2010  . ALLERGIC RHINITIS 02/02/2010  . 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 Depression 02/05/2008  . TUBAL PREGNANCY 02/05/2008  . 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   ATORVASTATIN (LIPITOR) 20 MG TABLET    Take 20 mg by mouth daily.   CHOLECALCIFEROL (VITAMIN D PO)    Take 1 tablet by mouth daily.   DICYCLOMINE (BENTYL) 20 MG TABLET    TAKE 1 TABLET BY MOUTH 3 TIMES DAILY BEFORE MEALS.   ELVITEGRAVIR-COBICISTAT-EMTRICITABINE-TENOFOVIR (GENVOYA) 150-150-200-10 MG TABS TABLET    TAKE 1 TABLET BY MOUTH DAILY WITH BREAKFAST. PLEASE CALL OFFICE FOR APPOINTMENT.   FERROUS SULFATE 325 (65 FE) MG TABLET    Take 325 mg by mouth daily with breakfast.   PANTOPRAZOLE (PROTONIX) 20 MG TABLET    Take 1 tablet (20 mg total) by mouth daily.   SIMVASTATIN (ZOCOR) 20 MG TABLET      Modified Medications   No medications on file  Discontinued Medications   No medications on file    Subjective: Alyssa Wilson is in for her routine HIV follow-up visit.  She has had no problems obtaining, taking or tolerating her Genvoya.  She takes it each morning with breakfast.  She cannot recall ever missing a dose.  She is bothered by some right knee pain from her degenerative arthritis.  She still tries to be active.  She attempts to walk at least 5000 steps every day at  work.  She occasionally takes Aleve for this pain but does not take anything on a regular basis.  She is struggling to lose weight.  She was able to lose about 15 pounds last year while on Weight [employer] but has regained all of that now.  She blames her weight gain on stress related to her work.  Review of Systems: Review of Systems  Constitutional: Negative for chills, diaphoresis, fever, malaise/fatigue and weight loss.  HENT: Negative for sore throat.   Respiratory: Negative for cough, sputum production and shortness of breath.   Cardiovascular: Negative for chest pain.  Gastrointestinal: Negative for abdominal pain, diarrhea, heartburn, nausea and vomiting.  Genitourinary: Negative for dysuria and frequency.  Musculoskeletal: Positive for joint pain. Negative for myalgias.  Skin: Negative for rash.  Neurological: Negative for dizziness and headaches.  Psychiatric/Behavioral: Negative for depression and substance abuse. The patient is not nervous/anxious.        Work related "stress".    Past Medical History:  Diagnosis Date  . Anemia   . Headache(784.0)   . HIV (human immunodeficiency virus infection) ([place])     Social History   Tobacco Use  . Smoking status: Never Smoker  . Smokeless tobacco: Never Used  Substance Use Topics  . Alcohol use: No    Alcohol/week: 0.5 standard drinks    Types: 1 drink(s) per week  .  Drug use: No    No family history on file.  No Known Allergies  Health Maintenance  Topic Date Due  . TETANUS/TDAP  08/08/1988  . INFLUENZA VACCINE  03/08/2018  . HIV Screening  Completed    Objective:  Vitals:   10/08/18 1341  BP: 123/82  Pulse: (!) 101  Weight: 204 lb (92.5 kg)  Height: 5\' 2"  (1.575 m)   Body mass index is 37.31 kg/m.  Physical Exam Constitutional:      Comments: She is smiling and in good spirits as usual.  Her weight is up 14 pounds over the past year.  HENT:     Mouth/Throat:     Pharynx: No oropharyngeal exudate.    Eyes:     Conjunctiva/sclera: Conjunctivae normal.  Cardiovascular:     Rate and Rhythm: Normal rate and regular rhythm.     Heart sounds: No murmur.  Pulmonary:     Effort: Pulmonary effort is normal.     Breath sounds: Normal breath sounds.  Abdominal:     Palpations: Abdomen is soft. There is no mass.     Tenderness: There is no abdominal tenderness.  Musculoskeletal: Normal range of motion.        General: No swelling or tenderness.  Skin:    Findings: No rash.  Neurological:     Mental Status: She is alert and oriented to person, place, and time.  Psychiatric:        Mood and Affect: Mood normal.     Lab Results Lab Results  Component Value Date   WBC 6.7 09/24/2018   HGB 13.0 09/24/2018   HCT 38.6 09/24/2018   MCV 79.9 (L) 09/24/2018   PLT 135 (L) 09/24/2018    Lab Results  Component Value Date   CREATININE 1.37 (H) 09/24/2018   BUN 21 09/24/2018   NA 137 09/24/2018   K 4.0 09/24/2018   CL 104 09/24/2018   CO2 26 09/24/2018    Lab Results  Component Value Date   ALT 17 09/24/2018   AST 16 09/24/2018   ALKPHOS 82 11/07/2016   BILITOT 0.4 09/24/2018    Lab Results  Component Value Date   CHOL 188 03/05/2018   HDL 66 03/05/2018   LDLCALC 104 (H) 03/05/2018   TRIG 86 03/05/2018   CHOLHDL 2.8 03/05/2018   Lab Results  Component Value Date   LABRPR NON-REACTIVE 09/24/2018   HIV 1 RNA Quant (copies/mL)  Date Value  09/24/2018 <20 NOT DETECTED  03/05/2018 <20 NOT DETECTED  06/14/2017 38 (H)   CD4 T Cell Abs (/uL)  Date Value  09/24/2018 890  03/05/2018 880  06/14/2017 430     Problem List Items Addressed This Visit      High   Human immunodeficiency virus (HIV) disease ([place])    Her infection remains under excellent, long-term control.  She received her influenza and meningococcal vaccine today.  She will follow-up after lab work in 1 year.  She will continue Genvoya.      Relevant Orders   CBC   T-helper cell (CD4)- ([facility] clinic only)    Comprehensive metabolic panel   Lipid panel   RPR   HIV-1 RNA quant-no reflex-bld     Medium   Obesity    I encouraged her to try to increase her activity level and perhaps go back on the same diet she used successfully last year with [employer].      Dyslipidemia    She will continue simvastatin  and have repeat lab work at the time of her next visit.        Unprioritized   Depression    Her depression is in remission.           Michel Bickers, MD [facility] for Infect[facility] [place] [facility] [phone] pager   [phone] cell 10/08/2018, 1:59 PM

## 2018-10-19 ENCOUNTER — Other Ambulatory Visit: Payer: Self-pay

## 2018-10-19 ENCOUNTER — Ambulatory Visit
Admission: RE | Admit: 2018-10-19 | Discharge: 2018-10-19 | Disposition: A | Discharge status: Home / Self Care | Payer: 59 | Source: Ambulatory Visit | Attending: Family Medicine | Admitting: Family Medicine

## 2018-10-19 DIAGNOSIS — Z1231 Encounter for screening mammogram for malignant neoplasm of breast: Secondary | ICD-10-CM

## 2018-10-23 ENCOUNTER — Other Ambulatory Visit: Payer: Self-pay

## 2018-10-23 ENCOUNTER — Other Ambulatory Visit: Payer: Self-pay | Admitting: Internal Medicine

## 2018-10-23 DIAGNOSIS — B20 Human immunodeficiency virus [HIV] disease: Secondary | ICD-10-CM

## 2018-10-23 MED ORDER — ELVITEG-COBIC-EMTRICIT-TENOFAF 150-150-200-10 MG PO TABS: ORAL_TABLET | ORAL | 5 refills | Status: DC

## 2018-11-02 ENCOUNTER — Other Ambulatory Visit: Payer: Self-pay

## 2018-11-02 ENCOUNTER — Other Ambulatory Visit: Payer: Self-pay | Admitting: Internal Medicine

## 2018-11-02 DIAGNOSIS — B20 Human immunodeficiency virus [HIV] disease: Secondary | ICD-10-CM

## 2018-11-02 MED ORDER — ELVITEG-COBIC-EMTRICIT-TENOFAF 150-150-200-10 MG PO TABS: 1.0000 | ORAL_TABLET | Freq: Every day | ORAL | 5 refills | Status: DC

## 2018-12-14 ENCOUNTER — Other Ambulatory Visit: Payer: Self-pay

## 2018-12-14 DIAGNOSIS — B20 Human immunodeficiency virus [HIV] disease: Secondary | ICD-10-CM

## 2018-12-14 MED ORDER — ELVITEG-COBIC-EMTRICIT-TENOFAF 150-150-200-10 MG PO TABS: 1.0000 | ORAL_TABLET | Freq: Every day | ORAL | 5 refills | Status: DC

## 2018-12-19 ENCOUNTER — Other Ambulatory Visit: Payer: Self-pay | Admitting: *Deleted

## 2018-12-19 ENCOUNTER — Other Ambulatory Visit: Payer: Self-pay | Admitting: Internal Medicine

## 2018-12-19 ENCOUNTER — Telehealth: Payer: Self-pay | Admitting: *Deleted

## 2018-12-19 DIAGNOSIS — B20 Human immunodeficiency virus [HIV] disease: Secondary | ICD-10-CM

## 2018-12-19 MED ORDER — ELVITEG-COBIC-EMTRICIT-TENOFAF 150-150-200-10 MG PO TABS: 1.0000 | ORAL_TABLET | Freq: Every day | ORAL | 5 refills | Status: DC

## 2018-12-19 MED ORDER — ATORVASTATIN CALCIUM 20 MG PO TABS: 20.0000 mg | ORAL_TABLET | Freq: Every day | ORAL | 11 refills | Status: DC

## 2018-12-19 NOTE — Telephone Encounter (Signed)
I refilled her atorvastatin and removed the simvastatin from her medication list.  Thanks.

## 2018-12-19 NOTE — Telephone Encounter (Signed)
Patient called back - she takes the simvastatin at night.  She states that her pharmacy changed her last year from atorvastatin to simvastatin (cheaper). She is fine if switched back to atorvastatin, she picks it up from [facility] at [place].   Please advise.

## 2018-12-19 NOTE — Telephone Encounter (Signed)
RN refilled Alyssa Wilson per specialty pharmacy request. There is a contraindication between Alyssa Wilson and her listed simvastatin (atorvastatin ok per Cassie).  RN called to clarify if she is taking atorvastatin or simvastatin, left message asking her to call back.  Landis Gandy, RN

## 2018-12-19 NOTE — Telephone Encounter (Signed)
Thank you :)

## 2019-06-04 ENCOUNTER — Encounter (INDEPENDENT_AMBULATORY_CARE_PROVIDER_SITE_OTHER): Payer: Self-pay

## 2019-06-25 ENCOUNTER — Other Ambulatory Visit: Payer: Self-pay | Admitting: Internal Medicine

## 2019-06-25 DIAGNOSIS — B20 Human immunodeficiency virus [HIV] disease: Secondary | ICD-10-CM

## 2019-08-07 ENCOUNTER — Encounter: Payer: Self-pay | Admitting: Neurology

## 2019-09-02 ENCOUNTER — Other Ambulatory Visit: Payer: Self-pay

## 2019-09-02 ENCOUNTER — Ambulatory Visit (INDEPENDENT_AMBULATORY_CARE_PROVIDER_SITE_OTHER): Payer: No Typology Code available for payment source | Admitting: Neurology

## 2019-09-02 ENCOUNTER — Encounter: Payer: Self-pay | Admitting: Neurology

## 2019-09-02 VITALS — BP 114/79 | HR 98 | Ht 62.25 in | Wt 212.5 lb

## 2019-09-02 DIAGNOSIS — B20 Human immunodeficiency virus [HIV] disease: Secondary | ICD-10-CM

## 2019-09-02 DIAGNOSIS — R4789 Other speech disturbances: Secondary | ICD-10-CM

## 2019-09-02 DIAGNOSIS — R202 Paresthesia of skin: Secondary | ICD-10-CM | POA: Diagnosis not present

## 2019-09-02 NOTE — Patient Instructions (Addendum)
MRI brain wwo contrast   We will notify you of the results\]

## 2019-09-02 NOTE — Progress Notes (Signed)
[facility] Clinic Note - Initial Visit   Date: 09/02/19  Alyssa Wilson MRN: [id number] DOB: 04-12-1971   Dear Dr. Justin Wilson:  Thank you for your kind referral of Alyssa Wilson for consultation of paresthesias. Although her history is well known to you, please allow [place] to reiterate it for the purpose of our medical record. The patient was accompanied to the clinic by self.    History of Present Illness: Alyssa Wilson is a 49 y.o. left-handed female with HIV, hyperlipidemia, and GERD presenting for evaluation of left side tingling and neck pain.   Starting in April 2020, she developed sharp pain from the right shoulder into her scalp and treated the pain with aspirin.  Following this, she began having various other symptoms including eye swelling, neck pain, left arm fullness, and left hand and foot tingling.  She complains of tingling in the leg after prolonged sitting which is improved with stretching and walking.  She complains of numbness/tingling in the arms which about twice per day and quickly resolves with shaking her arms. She complains of word-finding difficulty or switches words. She denies any weakness.  Neck pain has markedly improved with muscle relaxants.    She works full-time doing Teaching laboratory technician work.  She lives at home with her husband. She has gained 20+ lb during the pandemic.  Out-side paper records, electronic medical record, and images have been reviewed where available and summarized as:  Labs 04/10/2019: TSH 1.7, vitamin D 33.2, hemoglobin A1c 5.3   Past Medical History:  Diagnosis Date  . Anemia   . Headache(784.0)   . HIV (human immunodeficiency virus infection) ([place])     Past Surgical History:  Procedure Laterality Date  . ABDOMINAL HYSTERECTOMY    . CERVICAL CONIZATION W/BX    . LAPAROSCOPY ABDOMEN DIAGNOSTIC     ectopic pregnant  1998     Medications:  Outpatient Encounter Medications as of 09/02/2019  Medication Sig  . Ascorbic  Acid (VITAMIN C) 500 MG CHEW Chew by mouth daily.  Marland Kitchen aspirin-acetaminophen-caffeine (EXCEDRIN MIGRAINE) 250-250-65 MG tablet Take 2 tablets by mouth every 6 (six) hours as needed for headache.  . Cholecalciferol (VITAMIN D PO) Take 1 tablet by mouth daily.  Marland Kitchen dicyclomine (BENTYL) 20 MG tablet TAKE 1 TABLET BY MOUTH 3 TIMES DAILY BEFORE MEALS.  . ferrous sulfate 325 (65 FE) MG tablet Take 325 mg by mouth daily with breakfast.  . GENVOYA 150-150-200-10 MG TABS tablet TAKE ONE TABLET BY MOUTH ONCE DAILY WITH FOOD. STORE IN ORIGINAL CONTAINER AT ROOM TEMPERATURE.  . pantoprazole (PROTONIX) 20 MG tablet Take 1 tablet (20 mg total) by mouth daily.  . simvastatin (ZOCOR) 20 MG tablet Take 20 mg by mouth daily.  . [DISCONTINUED] atorvastatin (LIPITOR) 20 MG tablet Take 1 tablet (20 mg total) by mouth daily.   No facility-administered encounter medications on file as of 09/02/2019.    Allergies: No Known Allergies  Family History: Family History  Problem Relation Age of Onset  . Heart failure Mother   . Stroke Mother   . Diabetes Mother   . Lupus Mother   . Fibromyalgia Mother   . Lupus Brother     Social History: Social History   Tobacco Use  . Smoking status: Never Smoker  . Smokeless tobacco: Never Used  Substance Use Topics  . Alcohol use: No    Alcohol/week: 0.5 standard drinks    Types: 1 drink(s) per week  . Drug use: No  Social History   Social History Narrative  . Not on file    Vital Signs:  BP 114/79   Pulse 98   Ht 5' 2.25" (1.581 m)   Wt 212 lb 8 oz (96.4 kg)   LMP 03/17/2012   SpO2 95%   BMI 38.56 kg/m   Neurological Exam: MENTAL STATUS including orientation to time, place, person, recent and remote memory, attention span and concentration, language, and fund of knowledge is normal.  Speech is not dysarthric.  CRANIAL NERVES: II:  No visual field defects.    III-IV-VI: Pupils equal round.  Normal conjugate, extra-ocular eye movements in all directions  of gaze.  No nystagmus.  No ptosis.   V:  Normal facial sensation.    VII:  Normal facial symmetry and movements.   VIII:  Normal hearing and vestibular function.   IX-X:  Normal palatal movement.   XI:  Normal shoulder shrug and head rotation.   XII:  Tongue is midline.   MOTOR:  No atrophy, fasciculations or abnormal movements.  No pronator drift. I do not appreciate any change in muscle bulk.  The left upper arm may appear fuller, no edema.  Upper Extremity:  Right  Left  Deltoid  5/5   5/5   Biceps  5/5   5/5   Triceps  5/5   5/5   Infraspinatus 5/5  5/5  Medial pectoralis 5/5  5/5  Wrist extensors  5/5   5/5   Wrist flexors  5/5   5/5   Finger extensors  5/5   5/5   Finger flexors  5/5   5/5   Dorsal interossei  5/5   5/5   Abductor pollicis  5/5   5/5   Tone (Ashworth scale)  0  0   Lower Extremity:  Right  Left  Hip flexors  5/5   5/5   Hip extensors  5/5   5/5   Adductor 5/5  5/5  Abductor 5/5  5/5  Knee flexors  5/5   5/5   Knee extensors  5/5   5/5   Dorsiflexors  5/5   5/5   Plantarflexors  5/5   5/5   Toe extensors  5/5   5/5   Toe flexors  5/5   5/5   Tone (Ashworth scale)  0  0   MSRs:  Right        Left                  brachioradialis 2+  2+  biceps 2+  2+  triceps 2+  2+  patellar 2+  2+  ankle jerk 2+  2+  Hoffman no  no  plantar response down  down   SENSORY:  Normal and symmetric perception of light touch, pinprick, vibration, and proprioception.  Romberg's sign absent.   COORDINATION/GAIT: Normal finger-to- nose-finger and heel-to-shin.  Intact rapid alternating movements bilaterally.  Able to rise from a chair without using arms.  Gait narrow based and stable. Tandem and stressed gait intact.    IMPRESSION: Mrs. Alyssa Wilson is a 49 year-old female referred for a constellation of symptoms including intermittent left sided paresthesias, word-finding difficulty, neck pain, and left arm fullness.  Her neurological exam is entirely normal and non-focal.   Her paresthesias seem to be transient and alleviated with stretching, suggesting this is likely compressive from positioning (sitting, typing, etc).  I am not sure what to make of her left arm fullness. Although my overall suspicion  for anything worrisome is low based on normal exam, MRI brain wwo contrast will be ordered to exclude demyelinating disease.  If normal, NCS/EMG of the left side will be the next step.    Thank you for allowing me to participate in patient's care.  If I can answer any additional questions, I would be pleased to do so.    Sincerely,    Yovany Clock K. Posey Pronto, DO

## 2019-09-05 ENCOUNTER — Other Ambulatory Visit: Payer: Self-pay | Admitting: Family Medicine

## 2019-09-05 DIAGNOSIS — Z1231 Encounter for screening mammogram for malignant neoplasm of breast: Secondary | ICD-10-CM

## 2019-09-10 ENCOUNTER — Telehealth: Payer: Self-pay | Admitting: Neurology

## 2019-09-10 NOTE — Telephone Encounter (Signed)
MRI brain approved, prior auth# [id number] Valid 2/1 - 03/07/2020.

## 2019-09-11 ENCOUNTER — Ambulatory Visit
Admission: RE | Admit: 2019-09-11 | Discharge: 2019-09-11 | Disposition: A | Discharge status: Home / Self Care | Payer: No Typology Code available for payment source | Source: Ambulatory Visit | Attending: Neurology | Admitting: Neurology

## 2019-09-11 ENCOUNTER — Other Ambulatory Visit: Payer: Self-pay

## 2019-09-11 DIAGNOSIS — R4789 Other speech disturbances: Secondary | ICD-10-CM

## 2019-09-11 DIAGNOSIS — R202 Paresthesia of skin: Secondary | ICD-10-CM

## 2019-09-11 MED ORDER — GADOBENATE DIMEGLUMINE 529 MG/ML IV SOLN
20.0000 mL | Freq: Once | INTRAVENOUS | Status: AC | PRN
  Administered 2019-09-11: 15:00:00 20 mL via INTRAVENOUS

## 2019-09-12 ENCOUNTER — Telehealth: Payer: Self-pay | Admitting: Neurology

## 2019-09-12 ENCOUNTER — Telehealth: Payer: Self-pay

## 2019-09-12 DIAGNOSIS — R202 Paresthesia of skin: Secondary | ICD-10-CM

## 2019-09-12 NOTE — Telephone Encounter (Signed)
-----   Message from Alda Berthold, DO sent at 09/12/2019  8:07 AM EST ----- Please inform patient that her MRI brain is normal.  No evidence of multiple sclerosis, stroke, or tumor.  The next step is nerve testing of the left arm and leg.  If she is agreeable, please order NCS/EMG. Please ask her not to apply lotion/oils to the left arm and leg on the day of testing.  Thanks.

## 2019-09-12 NOTE — Telephone Encounter (Signed)
Patient is returning a call. She would like her MRI results. She had her MRI done on 09-11-19

## 2019-10-08 ENCOUNTER — Other Ambulatory Visit: Payer: 59

## 2019-10-09 ENCOUNTER — Other Ambulatory Visit: Payer: Self-pay

## 2019-10-09 ENCOUNTER — Other Ambulatory Visit: Payer: Self-pay | Admitting: *Deleted

## 2019-10-09 ENCOUNTER — Other Ambulatory Visit: Payer: 59

## 2019-10-09 DIAGNOSIS — Z79899 Other long term (current) drug therapy: Secondary | ICD-10-CM

## 2019-10-09 DIAGNOSIS — B20 Human immunodeficiency virus [HIV] disease: Secondary | ICD-10-CM

## 2019-10-09 DIAGNOSIS — Z113 Encounter for screening for infections with a predominantly sexual mode of transmission: Secondary | ICD-10-CM

## 2019-10-10 LAB — URINE CYTOLOGY ANCILLARY ONLY
Chlamydia: NEGATIVE
Comment: NEGATIVE
Comment: NORMAL
Neisseria Gonorrhea: NEGATIVE

## 2019-10-10 LAB — T-HELPER CELL (CD4) - (RCID CLINIC ONLY)
CD4 % Helper T Cell: 45 % (ref 33–65)
CD4 T Cell Abs: 1211 /uL (ref 400–1790)

## 2019-10-11 LAB — COMPLETE METABOLIC PANEL WITH GFR
AG Ratio: 1.4 (calc) (ref 1.0–2.5)
ALT: 19 U/L (ref 6–29)
AST: 16 U/L (ref 10–35)
Albumin: 4.3 g/dL (ref 3.6–5.1)
Alkaline phosphatase (APISO): 107 U/L (ref 37–153)
BUN/Creatinine Ratio: 13 (calc) (ref 6–22)
BUN: 15 mg/dL (ref 7–25)
CO2: 26 mmol/L (ref 20–32)
Calcium: 9.7 mg/dL (ref 8.6–10.4)
Chloride: 103 mmol/L (ref 98–110)
Creat: 1.19 mg/dL — ABNORMAL HIGH (ref 0.50–1.05)
GFR, Est African American: 62 mL/min/{1.73_m2} (ref >=60)
GFR, Est Non African American: 53 mL/min/{1.73_m2} — ABNORMAL LOW (ref >=60)
Globulin: 3.1 g/dL (calc) (ref 1.9–3.7)
Glucose, Bld: 104 mg/dL — ABNORMAL HIGH (ref 65–99)
Potassium: 3.9 mmol/L (ref 3.5–5.3)
Sodium: 137 mmol/L (ref 135–146)
Total Bilirubin: 0.4 mg/dL (ref 0.2–1.2)
Total Protein: 7.4 g/dL (ref 6.1–8.1)

## 2019-10-11 LAB — CBC WITH DIFFERENTIAL/PLATELET
Absolute Monocytes: 608 cells/uL (ref 200–950)
Basophils Absolute: 31 cells/uL (ref 0–200)
Basophils Relative: 0.4 %
Eosinophils Absolute: 62 cells/uL (ref 15–500)
Eosinophils Relative: 0.8 %
HCT: 39.7 % (ref 35.0–45.0)
Hemoglobin: 13.4 g/dL (ref 11.7–15.5)
Lymphs Abs: 2629 cells/uL (ref 850–3900)
MCH: 26.9 pg — ABNORMAL LOW (ref 27.0–33.0)
MCHC: 33.8 g/dL (ref 32.0–36.0)
MCV: 79.6 fL — ABNORMAL LOW (ref 80.0–100.0)
MPV: 12.6 fL — ABNORMAL HIGH (ref 7.5–12.5)
Monocytes Relative: 7.8 %
Neutro Abs: 4469 cells/uL (ref 1500–7800)
Neutrophils Relative %: 57.3 %
Platelets: 138 10*3/uL — ABNORMAL LOW (ref 140–400)
RBC: 4.99 10*6/uL (ref 3.80–5.10)
RDW: 14.5 % (ref 11.0–15.0)
Total Lymphocyte: 33.7 %
WBC: 7.8 10*3/uL (ref 3.8–10.8)

## 2019-10-11 LAB — LIPID PANEL
Cholesterol: 187 mg/dL (ref <200)
HDL: 64 mg/dL (ref >=50)
LDL Cholesterol (Calc): 105 mg/dL (calc) — ABNORMAL HIGH
Non-HDL Cholesterol (Calc): 123 mg/dL (calc) (ref <130)
Total CHOL/HDL Ratio: 2.9 (calc) (ref <5.0)
Triglycerides: 88 mg/dL (ref <150)

## 2019-10-11 LAB — HIV-1 RNA QUANT-NO REFLEX-BLD
HIV 1 RNA Quant: <20 copies/mL — AB
HIV-1 RNA Quant, Log: <1.3 Log copies/mL — AB

## 2019-10-11 LAB — RPR: RPR Ser Ql: NONREACTIVE

## 2019-10-21 ENCOUNTER — Ambulatory Visit
Admission: RE | Admit: 2019-10-21 | Discharge: 2019-10-21 | Disposition: A | Discharge status: Home / Self Care | Payer: No Typology Code available for payment source | Source: Ambulatory Visit | Attending: Family Medicine | Admitting: Family Medicine

## 2019-10-21 ENCOUNTER — Other Ambulatory Visit: Payer: Self-pay

## 2019-10-21 DIAGNOSIS — N181 Chronic kidney disease, stage 1: Secondary | ICD-10-CM | POA: Insufficient documentation

## 2019-10-21 DIAGNOSIS — Z1231 Encounter for screening mammogram for malignant neoplasm of breast: Secondary | ICD-10-CM

## 2019-10-22 ENCOUNTER — Encounter: Payer: 59 | Admitting: Internal Medicine

## 2019-11-09 ENCOUNTER — Ambulatory Visit: Payer: 59 | Attending: Internal Medicine

## 2019-11-09 DIAGNOSIS — Z23 Encounter for immunization: Secondary | ICD-10-CM

## 2019-11-09 NOTE — Progress Notes (Signed)
   Covid-19 Vaccination Clinic  Name:  Alyssa Wilson    MRN: [id number] DOB: [birth year]  11/09/2019  [name] was observed post Covid-19 immunization for 15 minutes without incident. She was provided with Vaccine Information Sheet and instruction to access the V-Safe system.   [name] was instructed to call 911 with any severe reactions post vaccine: Marland Kitchen Difficulty breathing  . Swelling of face and throat  . A fast heartbeat  . A bad rash all over body  . Dizziness and weakness   Immunizations Administered    Name Date Dose VIS Date Route   Pfizer COVID-19 Vaccine 11/09/2019  8:38 AM 0.3 mL 07/19/2019 Intramuscular   Manufacturer: [place]   Lot: [id number]   [place]: [id number]

## 2019-12-04 ENCOUNTER — Ambulatory Visit: Payer: 59 | Attending: Internal Medicine

## 2019-12-04 DIAGNOSIS — Z23 Encounter for immunization: Secondary | ICD-10-CM

## 2019-12-04 NOTE — Progress Notes (Signed)
   Covid-19 Vaccination Clinic  Name:  Alyssa Wilson    MRN: [id number] DOB: 1971-06-12  12/04/2019  [name] was observed post Covid-19 immunization for 15 minutes without incident. She was provided with Vaccine Information Sheet and instruction to access the V-Safe system.   [name] was instructed to call 911 with any severe reactions post vaccine: Marland Kitchen Difficulty breathing  . Swelling of face and throat  . A fast heartbeat  . A bad rash all over body  . Dizziness and weakness   Immunizations Administered    Name Date Dose VIS Date Route   Pfizer COVID-19 Vaccine 12/04/2019  8:19 AM 0.3 mL 10/02/2018 Intramuscular   Manufacturer: [place]   Lot: [id number]   [place]: [id number]

## 2019-12-26 ENCOUNTER — Other Ambulatory Visit: Payer: Self-pay | Admitting: Internal Medicine

## 2019-12-26 ENCOUNTER — Other Ambulatory Visit: Payer: Self-pay

## 2019-12-26 DIAGNOSIS — B20 Human immunodeficiency virus [HIV] disease: Secondary | ICD-10-CM

## 2019-12-26 MED ORDER — GENVOYA 150-150-200-10 MG PO TABS: ORAL_TABLET | ORAL | 0 refills | Status: DC

## 2020-01-02 ENCOUNTER — Telehealth (INDEPENDENT_AMBULATORY_CARE_PROVIDER_SITE_OTHER): Payer: No Typology Code available for payment source | Admitting: Internal Medicine

## 2020-01-02 ENCOUNTER — Encounter: Payer: Self-pay | Admitting: Internal Medicine

## 2020-01-02 ENCOUNTER — Other Ambulatory Visit: Payer: Self-pay

## 2020-01-02 VITALS — Ht 62.0 in | Wt 204.0 lb

## 2020-01-02 DIAGNOSIS — B20 Human immunodeficiency virus [HIV] disease: Secondary | ICD-10-CM | POA: Diagnosis not present

## 2020-01-02 MED ORDER — GENVOYA 150-150-200-10 MG PO TABS: ORAL_TABLET | ORAL | 11 refills | Status: DC

## 2020-01-02 NOTE — Progress Notes (Signed)
Virtual Visit via Telephone Note  I connected with Alyssa Wilson on 01/02/20 at  3:45 PM EDT by telephone and verified that I am speaking with the correct person using two identifiers.  Location: Patient: Work Provider: [facility]   I discussed the limitations, risks, security and privacy concerns of performing an evaluation and management service by telephone and the availability of in person appointments. I also discussed with the patient that there may be a patient responsible charge related to this service. The patient expressed understanding and agreed to proceed.   History of Present Illness: I called and spoke with Alyssa Wilson by phone today.  She has not had any problems obtaining, taking or tolerating Genvoya and has not missed any doses.  She continues to work from home during the [employer] pandemic and enjoys it very much.  She is not having any problems with depression or anxiety.   Observations/Objective: HIV 1 RNA Quant (copies/mL)  Date Value  10/09/2019 <20 DETECTED (A)  09/24/2018 <20 NOT DETECTED  03/05/2018 <20 NOT DETECTED   CD4 T Cell Abs (/uL)  Date Value  10/09/2019 1,211  09/24/2018 890  03/05/2018 880    Assessment and Plan: Her infection remains under excellent, long-term control.  She will continue Genvoya and follow-up after lab work in 1 year.  Follow Up Instructions: Continue Genvoya Follow-up after lab work in 1 year   I discussed the assessment and treatment plan with the patient. The patient was provided an opportunity to ask questions and all were answered. The patient agreed with the plan and demonstrated an understanding of the instructions.   The patient was advised to call back or seek an in-person evaluation if the symptoms worsen or if the condition fails to improve as anticipated.  I provided 13 minutes of non-face-to-face time during this encounter.   Michel Bickers, MD

## 2020-04-02 ENCOUNTER — Telehealth: Payer: Self-pay | Admitting: Pharmacy Technician

## 2020-04-02 NOTE — Telephone Encounter (Signed)
[facility] Patient [facility]ocate Encounter   I filled out and got doctor's signature on [employer]'s required diagnosis verification form.  It was faxed with fax confirmation.  Alyssa Wilson. Alyssa Wilson Patient [facility] for Infectious Disease Phone: [phone] Fax:  [phone]

## 2020-05-13 NOTE — Progress Notes (Signed)
Cardiology Office Note:    Date:  05/15/2020   ID:  Mallie Snooks, DOB 02-Mar-1971, MRN [id number]  PCP:  Maurice Small, MD  [facility] Cardiologist:  No primary care provider on file.  [facility] Electrophysiologist:  None   Referring MD: Maurice Small, MD    History of Present Illness:    Alyssa Wilson is a 49 y.o. female with a hx of HIV, HLD amd GERD who was referred by Dr. Justin Mend for evaluation of LUE swelling.   Note from Dr. Justin Mend dated 04/14/20 reviewed. Patient states that the upper extremities have been increasing in size with L>R over the past year. No swelling in the arms or hands. No pain with changing positions of the arms. No numbness or tingling or limited ROM. Has been steadily increasing in the tricep region and this has been very frustrating to her as she has not changed her lifestyle and has lost weight over the past couple of months. Of note, she is on Genvoya for HIV. HIV has been very well controlled. She is concerned that the medication may be contributing to her symptoms.  No significant chest pain but occasional SOB. No exertional symptoms. Able to walk a flight of stairs without issues. Was able to walk 2-80miles daily. No palpitations, lightheadedness, syncope, nausea, or vomiting. Has occasional left sided neck pain and leg pain that she attributes to sitting for prolonged periods at work.  Labs 04/14/20:  TC 269, HDL 67, LDL 188, TG 81, TSH 0.96, Cr 1.26, Na 138, AST 20, ALT 29  Labs 10/2019: Na 137, K 3.9, Cr 1.2, AST 16, ALT 19 TC 187, HDL 64, LDL 105, TG 88 WBC 7.8, HgB 13.4, CD4 1211  Past Medical History:  Diagnosis Date  . Anemia   . Headache(784.0)   . HIV (human immunodeficiency virus infection) ([place])   . Morbid obesity ([place])     Past Surgical History:  Procedure Laterality Date  . ABDOMINAL HYSTERECTOMY    . CERVICAL CONIZATION W/BX    . LAPAROSCOPY ABDOMEN DIAGNOSTIC     ectopic pregnant  1998    Current Medications: Current Meds    Medication Sig  . Ascorbic Acid (VITAMIN C) 500 MG CHEW Chew by mouth daily.  . Cholecalciferol (VITAMIN D3 PO) Take by mouth daily.  Marland Kitchen elvitegravir-cobicistat-emtricitabine-tenofovir (GENVOYA) 150-150-200-10 MG TABS tablet TAKE ONE TABLET BY MOUTH ONCE DAILY WITH FOOD. STORE IN ORIGINAL CONTAINER AT ROOM TEMPERATURE.  . ferrous sulfate 325 (65 FE) MG tablet Take 325 mg by mouth daily with breakfast.  . meloxicam (MOBIC) 15 MG tablet Take 15 mg by mouth as needed.  . [DISCONTINUED] Cholecalciferol (VITAMIN D PO) Take 1 tablet by mouth daily.  . [DISCONTINUED] simvastatin (ZOCOR) 20 MG tablet Take 20 mg by mouth daily.     Allergies:   Patient has no known allergies.   Social History   Socioeconomic History  . Marital status: Married    Spouse name: Not on file  . Number of children: Not on file  . Years of education: Not on file  . Highest education level: Not on file  Occupational History  . Not on file  Tobacco Use  . Smoking status: Never Smoker  . Smokeless tobacco: Never Used  Vaping Use  . Vaping Use: Never used  Substance and Sexual Activity  . Alcohol use: No    Alcohol/week: 0.5 standard drinks    Types: 1 drink(s) per week  . Drug use: No  . Sexual  activity: Yes    Comment: declined condoms  Other Topics Concern  . Not on file  Social History Narrative   Left handed      College    Social Determinants of Health   Financial Resource Strain:   . Difficulty of Paying Living Expenses: Not on file  Food Insecurity:   . Worried About Charity fundraiser in the Last Year: Not on file  . Ran Out of Food in the Last Year: Not on file  Transportation Needs:   . Lack of Transportation (Medical): Not on file  . Lack of Transportation (Non-Medical): Not on file  Physical Activity:   . Days of Exercise per Week: Not on file  . Minutes of Exercise per Session: Not on file  Stress:   . Feeling of Stress : Not on file  Social Connections:   . Frequency of  Communication with Friends and Family: Not on file  . Frequency of Social Gatherings with Friends and Family: Not on file  . Attends Religious Services: Not on file  . Active Member of Clubs or Organizations: Not on file  . Attends Archivist Meetings: Not on file  . Marital Status: Not on file     Family History: The patient's family history includes Diabetes in her mother; Fibromyalgia in her mother; Heart failure in her mother; Lupus in her brother and mother; Stroke in her mother.  ROS:   Please see the history of present illness.    The patient denies chest pain, chest pressure, dyspnea at rest or with exertion, palpitations, PND, orthopnea. Denies cough, fever, chills. Denies nausea, vomiting. Denies syncope or presyncope. Denies dizziness or lightheadedness. Denies snoring.  EKGs/Labs/Other Studies Reviewed:    The following studies were reviewed today: CT abdomen/pelvis: FINDINGS: Lower chest: Normal heart size without pericardial or pleural effusion.  Hepatobiliary: Normal liver. Normal gallbladder, without biliary ductal dilatation.  Pancreas: Normal, without mass or ductal dilatation.  Spleen: Normal in size, without focal abnormality.  Adrenals/Urinary Tract: Normal adrenal glands. Normal kidneys, without hydronephrosis. Normal urinary bladder. Right pelvic calcifications are favored to be vascular.  Stomach/Bowel: portions of the proximal stomach are underdistended. Normal colon, appendix, and terminal ileum. Normal small bowel.  Vascular/Lymphatic: Normal caliber of the aorta and branch vessels. No abdominopelvic adenopathy.  Reproductive: Hysterectomy.  No adnexal mass.  Other: No significant free fluid.  Musculoskeletal: No acute osseous abnormality.  IMPRESSION: No acute process or explanation for right-sided pain.  EKG:  EKG is  ordered today.  The ekg ordered today demonstrates NSR with HR 71  Recent Labs: 10/09/2019: ALT 19;  BUN 15; Creat 1.19; Hemoglobin 13.4; Platelets 138; Potassium 3.9; Sodium 137  Recent Lipid Panel    Component Value Date/Time   CHOL 187 10/09/2019 1537   TRIG 88 10/09/2019 1537   HDL 64 10/09/2019 1537   CHOLHDL 2.9 10/09/2019 1537   VLDL 13 11/07/2016 1158   LDLCALC 105 (H) 10/09/2019 1537    Physical Exam:    VS:  BP 128/72   Pulse 71   Ht 5\' 2"  (1.575 m)   Wt 206 lb (93.4 kg)   LMP 03/17/2012   SpO2 98%   BMI 37.68 kg/m     Wt Readings from Last 3 Encounters:  05/15/20 206 lb (93.4 kg)  01/02/20 204 lb (92.5 kg)  09/02/19 212 lb 8 oz (96.4 kg)     GEN:  Well nourished, well developed in no acute distress HEENT: Normal NECK: No JVD;  No carotid bruits LYMPHATICS: No lymphadenopathy CARDIAC: RRR, no murmurs, rubs, gallops RESPIRATORY:  Clear to auscultation without rales, wheezing or rhonchi  ABDOMEN: Soft, non-tender, non-distended MUSCULOSKELETAL:  LUE is slightly bigger than right in the tricep region. No edema. Extremities are warm with good peripheral pulses SKIN: Warm and dry NEUROLOGIC:  Alert and oriented x 3 PSYCHIATRIC:  Normal affect   ASSESSMENT:    1. Pure hypercholesterolemia   2. Chest discomfort   3. Localized swelling, mass, or lump of upper extremity, left    PLAN:    In order of problems listed above:  #Upper extremity asymmetry: Possible fat redistribution in the setting of HIV infection and treatment. Low suspicion of LUE DVT or subclavian stenosis as no swelling, exertional symptoms, and the distribution is limited to the tricep region. Given reassuring cardiac history and examination, will defer further testing at this time unless new symptoms develop. -Recommend follow-up with infectious disease as do not suspect this to be cardiac in nature -If symptoms such as upper extremity swelling or claudication were to develop or if she develops exertional symptoms concerning for cardiac disease, will pursue further testing at that  time  #HLD: TC 269, HDL 67, LDL 188, TG 81. -Will switch from simvastatin 20mg  to crestor 10mg  -Follow-up cholesterol panel, LFTs and CK with PCP and ID in 30months   Medication Adjustments/Labs and Tests Ordered: Current medicines are reviewed at length with the patient today.  Concerns regarding medicines are outlined above.  Orders Placed This Encounter  Procedures  . EKG 12-Lead   Meds ordered this encounter  Medications  . rosuvastatin (CRESTOR) 10 MG tablet    Sig: Take 1 tablet (10 mg total) by mouth daily.    Dispense:  90 tablet    Refill:  3    Patient Instructions  Medication Instructions:   START TAKING:   ROSUVASTATIN(CRESTOR) 10 MG ONCE A DAY    STOP TAKING:   SIMVASTATIN 20 MG    *If you need a refill on your cardiac medications before your next appointment, please call your pharmacy*   Lab [place]   If you have labs (blood work) drawn today and your tests are completely normal, you will receive your results only by: Marland Kitchen MyChart Message (if you have MyChart) OR . A paper copy in the mail If you have any lab test that is abnormal or we need to change your treatment, we will call you to review the results.   Testing/Procedures: NONE ORDERED  TODAY    Follow-Up: At [facility], you and your health needs are our priority.  As part of our continuing mission to provide you with exceptional heart care, we have created designated Provider Care Teams.  These Care Teams include your primary Cardiologist (physician) and Advanced Practice Providers (APPs -  Physician Assistants and Nurse Practitioners) who all work together to provide you with the care you need, when you need it.  We recommend signing up for the patient portal called "MyChart".  Sign up information is provided on this After Visit Summary.  MyChart is used to connect with patients for Virtual Visits (Telemedicine).  Patients are able to view lab/test results, encounter notes,  upcoming appointments, etc.  Non-urgent messages can be sent to your provider as well.   To learn more about what you can do with MyChart, go to [email or web address].    Your next appointment:  CONTACT [facility] [phone] AS NEEDED FOR  ANY CARDIAC RELATED  SYMPTOMS for your next appointment.      Signed, Freada Bergeron, MD  05/15/2020 2:15 PM    [facility]

## 2020-05-15 ENCOUNTER — Ambulatory Visit (INDEPENDENT_AMBULATORY_CARE_PROVIDER_SITE_OTHER): Payer: No Typology Code available for payment source | Admitting: Cardiology

## 2020-05-15 ENCOUNTER — Encounter: Payer: Self-pay | Admitting: Cardiology

## 2020-05-15 ENCOUNTER — Other Ambulatory Visit: Payer: Self-pay

## 2020-05-15 VITALS — BP 128/72 | HR 71 | Ht 62.0 in | Wt 206.0 lb

## 2020-05-15 DIAGNOSIS — R0789 Other chest pain: Secondary | ICD-10-CM

## 2020-05-15 DIAGNOSIS — E7800 Pure hypercholesterolemia, unspecified: Secondary | ICD-10-CM | POA: Diagnosis not present

## 2020-05-15 DIAGNOSIS — R2232 Localized swelling, mass and lump, left upper limb: Secondary | ICD-10-CM | POA: Diagnosis not present

## 2020-05-15 MED ORDER — ROSUVASTATIN CALCIUM 10 MG PO TABS: 10.0000 mg | ORAL_TABLET | Freq: Every day | ORAL | 3 refills | Status: DC

## 2020-05-15 NOTE — Patient Instructions (Addendum)
Medication Instructions:   START TAKING:   ROSUVASTATIN(CRESTOR) 10 MG ONCE A DAY    STOP TAKING:   SIMVASTATIN 20 MG    *If you need a refill on your cardiac medications before your next appointment, please call your pharmacy*   Lab [place]   If you have labs (blood work) drawn today and your tests are completely normal, you will receive your results only by: Marland Kitchen MyChart Message (if you have MyChart) OR . A paper copy in the mail If you have any lab test that is abnormal or we need to change your treatment, we will call you to review the results.   Testing/Procedures: NONE ORDERED  TODAY    Follow-Up: At [facility], you and your health needs are our priority.  As part of our continuing mission to provide you with exceptional heart care, we have created designated Provider Care Teams.  These Care Teams include your primary Cardiologist (physician) and Advanced Practice Providers (APPs -  Physician Assistants and Nurse Practitioners) who all work together to provide you with the care you need, when you need it.  We recommend signing up for the patient portal called "MyChart".  Sign up information is provided on this After Visit Summary.  MyChart is used to connect with patients for Virtual Visits (Telemedicine).  Patients are able to view lab/test results, encounter notes, upcoming appointments, etc.  Non-urgent messages can be sent to your provider as well.   To learn more about what you can do with MyChart, go to [email or web address].    Your next appointment:  CONTACT [facility] [phone] AS NEEDED FOR  ANY CARDIAC RELATED SYMPTOMS for your next appointment.

## 2020-05-15 NOTE — Telephone Encounter (Signed)
Agree. This is not due to her Genvoya. Also agree to contact PCP. Thanks [place].

## 2020-10-13 ENCOUNTER — Other Ambulatory Visit: Payer: Self-pay | Admitting: Family Medicine

## 2020-10-13 DIAGNOSIS — Z1231 Encounter for screening mammogram for malignant neoplasm of breast: Secondary | ICD-10-CM

## 2020-12-07 ENCOUNTER — Ambulatory Visit
Admission: RE | Admit: 2020-12-07 | Discharge: 2020-12-07 | Disposition: A | Discharge status: Home / Self Care | Payer: No Typology Code available for payment source | Source: Ambulatory Visit | Attending: Family Medicine | Admitting: Family Medicine

## 2020-12-07 ENCOUNTER — Other Ambulatory Visit: Payer: Self-pay

## 2020-12-07 DIAGNOSIS — Z1231 Encounter for screening mammogram for malignant neoplasm of breast: Secondary | ICD-10-CM

## 2020-12-14 ENCOUNTER — Other Ambulatory Visit: Payer: No Typology Code available for payment source

## 2020-12-23 ENCOUNTER — Other Ambulatory Visit: Payer: Self-pay

## 2020-12-23 DIAGNOSIS — Z113 Encounter for screening for infections with a predominantly sexual mode of transmission: Secondary | ICD-10-CM

## 2020-12-23 DIAGNOSIS — Z79899 Other long term (current) drug therapy: Secondary | ICD-10-CM

## 2020-12-23 DIAGNOSIS — B20 Human immunodeficiency virus [HIV] disease: Secondary | ICD-10-CM

## 2020-12-29 ENCOUNTER — Other Ambulatory Visit (HOSPITAL_COMMUNITY)
Admission: RE | Admit: 2020-12-29 | Discharge: 2020-12-29 | Disposition: A | Discharge status: Home / Self Care | Payer: No Typology Code available for payment source | Source: Ambulatory Visit | Attending: Internal Medicine | Admitting: Internal Medicine

## 2020-12-29 ENCOUNTER — Other Ambulatory Visit: Payer: Self-pay

## 2020-12-29 ENCOUNTER — Other Ambulatory Visit: Payer: No Typology Code available for payment source

## 2020-12-29 DIAGNOSIS — Z113 Encounter for screening for infections with a predominantly sexual mode of transmission: Secondary | ICD-10-CM | POA: Insufficient documentation

## 2020-12-29 DIAGNOSIS — Z79899 Other long term (current) drug therapy: Secondary | ICD-10-CM

## 2020-12-29 DIAGNOSIS — B20 Human immunodeficiency virus [HIV] disease: Secondary | ICD-10-CM

## 2020-12-30 ENCOUNTER — Ambulatory Visit (INDEPENDENT_AMBULATORY_CARE_PROVIDER_SITE_OTHER): Payer: No Typology Code available for payment source

## 2020-12-30 ENCOUNTER — Ambulatory Visit (INDEPENDENT_AMBULATORY_CARE_PROVIDER_SITE_OTHER): Payer: No Typology Code available for payment source | Admitting: Orthopaedic Surgery

## 2020-12-30 VITALS — Ht 62.0 in | Wt 207.0 lb

## 2020-12-30 DIAGNOSIS — M25561 Pain in right knee: Secondary | ICD-10-CM

## 2020-12-30 DIAGNOSIS — G8929 Other chronic pain: Secondary | ICD-10-CM

## 2020-12-30 LAB — URINE CYTOLOGY ANCILLARY ONLY
Chlamydia: NEGATIVE
Comment: NEGATIVE
Comment: NORMAL
Neisseria Gonorrhea: NEGATIVE

## 2020-12-30 LAB — T-HELPER CELL (CD4) - (RCID CLINIC ONLY)
CD4 % Helper T Cell: 42 % (ref 33–65)
CD4 T Cell Abs: 850 /uL (ref 400–1790)

## 2020-12-30 MED ORDER — MELOXICAM 15 MG PO TABS: 15.0000 mg | ORAL_TABLET | Freq: Every day | ORAL | 3 refills | Status: DC | PRN

## 2020-12-30 NOTE — Progress Notes (Signed)
Office Visit Note   Patient: Alyssa Wilson           Date of Birth: 1971/06/21           MRN: [id number] Visit Date: 12/30/2020              Requested by: Maurice Small, MD [address],  [place] [id number] PCP: Maurice Small, MD   Assessment & Plan: Visit Diagnoses:  1. Chronic pain of right knee     Plan: Since she is feeling much better today after the meloxicam last evening, I would have her continue meloxicam daily for the next week to 2 weeks.  If she continues to have pain and mechanical symptoms my neck step would be to order an MRI of the right knee to rule out a meniscal tear or even a insufficiency fracture.  All questions and concerns were answered and addressed.  Follow-up will be as needed.  Follow-Up Instructions: Return if symptoms worsen or fail to improve.   Orders:  Orders Placed This Encounter  Procedures  . XR Knee 1-2 Views Right   Meds ordered this encounter  Medications  . meloxicam (MOBIC) 15 MG tablet    Sig: Take 1 tablet (15 mg total) by mouth daily as needed.    Dispense:  30 tablet    Refill:  3      Procedures: No procedures performed   Clinical Data: No additional findings.   Subjective: Chief Complaint  Patient presents with  . Right Knee - Pain  The patient comes in today with a few week history of acute right knee pain with some locking catching.  This occurred after dancing.  I have seen her for her knee remotely back in 2019.  She had been hurting quite a bit until last evening she did take the meloxicam that she had at home and today her knee is not hurting.  She was having some locking catching and she points the medial aspect of the right knee as source of her pain.  Her BMI is 37.86.  She has been trying to lose weight.  She said no other acute change in her medical status.  HPI  Review of Systems There is currently no listed headache, chest pain, shortness of breath, fever, chills, nausea,  vomiting  Objective: Vital Signs: Ht 5\' 2"  (1.575 m)   Wt 207 lb (93.9 kg)   LMP 03/17/2012   BMI 37.86 kg/m   Physical Exam She is alert and oriented x3 and in no acute distress Ortho Exam Examination of her right knee and left knee show they both hyperextend.  The right knee does have medial joint line tenderness and some tenderness of the medial compartment when I hyperflexed the knee.  There is not a true McMurray's or Lachman's exam but she is tender. Specialty Comments:  No specialty comments available.  Imaging: XR Knee 1-2 Views Right  Result Date: 12/30/2020 2 views of the right knee show no acute findings.  There is slight medial joint space narrowing.    PMFS History: Patient Active Problem List   Diagnosis Date Noted  . CRI (chronic renal insufficiency), stage 1 10/21/2019  . Pigmented skin lesion of uncertain nature 09/98/3382  . GERD (gastroesophageal reflux disease) 09/02/2015  . H. pylori infection 09/02/2015  . Dyslipidemia 07/22/2013  . Obesity 07/22/2013  . ANXIETY 06/24/2010  . ALLERGIC RHINITIS 02/02/2010  . 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 Depression 02/05/2008  . TUBAL  PREGNANCY 02/05/2008  . 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 Human immunodeficiency virus (HIV) disease ([place]) 01/22/2008   Past Medical History:  Diagnosis Date  . Anemia   . Headache(784.0)   . HIV (human immunodeficiency virus infection) ([place])   . Morbid obesity ([place])     Family History  Problem Relation Age of Onset  . Heart failure Mother   . Stroke Mother   . Diabetes Mother   . Lupus Mother   . Fibromyalgia Mother   . Lupus Brother     Past Surgical History:  Procedure Laterality Date  . ABDOMINAL HYSTERECTOMY    . CERVICAL CONIZATION W/BX    . LAPAROSCOPY ABDOMEN DIAGNOSTIC     ectopic pregnant  1998   Social History   Occupational History  . Not on file  Tobacco Use  . Smoking status: Never Smoker  . Smokeless tobacco: Never  Used  Vaping Use  . Vaping Use: Never used  Substance and Sexual Activity  . Alcohol use: No    Alcohol/week: 0.5 standard drinks    Types: 1 drink(s) per week  . Drug use: No  . Sexual activity: Yes    Comment: declined condoms

## 2020-12-31 LAB — HIV-1 RNA QUANT-NO REFLEX-BLD
HIV 1 RNA Quant: 47 Copies/mL — ABNORMAL HIGH
HIV-1 RNA Quant, Log: 1.68 Log cps/mL — ABNORMAL HIGH

## 2020-12-31 LAB — COMPREHENSIVE METABOLIC PANEL
AG Ratio: 1.4 (calc) (ref 1.0–2.5)
ALT: 26 U/L (ref 6–29)
AST: 19 U/L (ref 10–35)
Albumin: 4.4 g/dL (ref 3.6–5.1)
Alkaline phosphatase (APISO): 118 U/L (ref 37–153)
BUN: 15 mg/dL (ref 7–25)
CO2: 28 mmol/L (ref 20–32)
Calcium: 9.6 mg/dL (ref 8.6–10.4)
Chloride: 105 mmol/L (ref 98–110)
Creat: 1 mg/dL (ref 0.50–1.05)
Globulin: 3.1 g/dL (calc) (ref 1.9–3.7)
Glucose, Bld: 92 mg/dL (ref 65–99)
Potassium: 4.1 mmol/L (ref 3.5–5.3)
Sodium: 140 mmol/L (ref 135–146)
Total Bilirubin: 0.4 mg/dL (ref 0.2–1.2)
Total Protein: 7.5 g/dL (ref 6.1–8.1)

## 2020-12-31 LAB — CBC WITH DIFFERENTIAL/PLATELET
Absolute Monocytes: 466 cells/uL (ref 200–950)
Basophils Absolute: 50 cells/uL (ref 0–200)
Basophils Relative: 0.8 %
Eosinophils Absolute: 38 cells/uL (ref 15–500)
Eosinophils Relative: 0.6 %
HCT: 38.8 % (ref 35.0–45.0)
Hemoglobin: 12.8 g/dL (ref 11.7–15.5)
Lymphs Abs: 2174 cells/uL (ref 850–3900)
MCH: 26.8 pg — ABNORMAL LOW (ref 27.0–33.0)
MCHC: 33 g/dL (ref 32.0–36.0)
MCV: 81.3 fL (ref 80.0–100.0)
MPV: 12 fL (ref 7.5–12.5)
Monocytes Relative: 7.4 %
Neutro Abs: 3572 cells/uL (ref 1500–7800)
Neutrophils Relative %: 56.7 %
Platelets: 132 10*3/uL — ABNORMAL LOW (ref 140–400)
RBC: 4.77 10*6/uL (ref 3.80–5.10)
RDW: 14.3 % (ref 11.0–15.0)
Total Lymphocyte: 34.5 %
WBC: 6.3 10*3/uL (ref 3.8–10.8)

## 2020-12-31 LAB — LIPID PANEL
Cholesterol: 186 mg/dL (ref <200)
HDL: 68 mg/dL (ref >=50)
LDL Cholesterol (Calc): 101 mg/dL (calc) — ABNORMAL HIGH
Non-HDL Cholesterol (Calc): 118 mg/dL (calc) (ref <130)
Total CHOL/HDL Ratio: 2.7 (calc) (ref <5.0)
Triglycerides: 76 mg/dL (ref <150)

## 2020-12-31 LAB — RPR: RPR Ser Ql: NONREACTIVE

## 2021-01-05 ENCOUNTER — Ambulatory Visit: Payer: No Typology Code available for payment source | Admitting: Internal Medicine

## 2021-01-06 ENCOUNTER — Other Ambulatory Visit: Payer: Self-pay | Admitting: Internal Medicine

## 2021-01-06 DIAGNOSIS — B20 Human immunodeficiency virus [HIV] disease: Secondary | ICD-10-CM

## 2021-01-15 ENCOUNTER — Other Ambulatory Visit: Payer: Self-pay

## 2021-01-19 ENCOUNTER — Other Ambulatory Visit: Payer: Self-pay

## 2021-01-19 ENCOUNTER — Telehealth (INDEPENDENT_AMBULATORY_CARE_PROVIDER_SITE_OTHER): Payer: No Typology Code available for payment source | Admitting: Internal Medicine

## 2021-01-19 DIAGNOSIS — B20 Human immunodeficiency virus [HIV] disease: Secondary | ICD-10-CM | POA: Diagnosis not present

## 2021-01-19 MED ORDER — BICTEGRAVIR-EMTRICITAB-TENOFOV 50-200-25 MG PO TABS: 1.0000 | ORAL_TABLET | Freq: Every day | ORAL | 11 refills | Status: DC

## 2021-01-19 NOTE — Progress Notes (Signed)
Virtual Visit via Telephone Note  I connected with Alyssa Wilson on 01/19/21 at  2:45 PM EDT by telephone and verified that I am speaking with the correct person using two identifiers.  Location: Patient: Home Provider: [facility]   I discussed the limitations, risks, security and privacy concerns of performing an evaluation and management service by telephone and the availability of in person appointments. I also discussed with the patient that there may be a patient responsible charge related to this service. The patient expressed understanding and agreed to proceed.   History of Present Illness: I called and spoke with Alyssa Wilson today.  She continues to take  Genvoya each morning.  She tells me that she does not always take it with a meal when she gets busy in the morning.  She recalls missing only 2 doses when she forgot to reorder her refill when she went out of town.  She has received a first and second COVID booster.  Her mother died several months ago so she has been grieving some but is starting to feel better and focus on the good memories.   Observations/Objective: HIV 1 RNA Quant  Date Value  12/29/2020 47 Copies/mL (H)  10/09/2019 <20 DETECTED copies/mL (A)  09/24/2018 <20 NOT DETECTED copies/mL   CD4 T Cell Abs (/uL)  Date Value  12/29/2020 850  10/09/2019 1,211  09/24/2018 890     Assessment and Plan: She has some very low level viral activation but overall her infection has been under excellent, long-term control.  I will change her Genvoya to [place] which she can take with or without food.  Follow Up Instructions: Change Genvoya to [place] Follow-up here in 6 months for repeat lab work   I discussed the assessment and treatment plan with the patient. The patient was provided an opportunity to ask questions and all were answered. The patient agreed with the plan and demonstrated an understanding of the instructions.   The patient was advised to call back or seek an  in-person evaluation if the symptoms worsen or if the condition fails to improve as anticipated.  I provided 15 minutes of non-face-to-face time during this encounter.   Michel Bickers, MD

## 2021-01-20 DIAGNOSIS — B20 Human immunodeficiency virus [HIV] disease: Secondary | ICD-10-CM

## 2021-01-21 MED ORDER — BICTEGRAVIR-EMTRICITAB-TENOFOV 50-200-25 MG PO TABS: 1.0000 | ORAL_TABLET | Freq: Every day | ORAL | 11 refills | Status: DC

## 2021-05-03 ENCOUNTER — Other Ambulatory Visit: Payer: Self-pay | Admitting: Family Medicine

## 2021-05-03 DIAGNOSIS — Z780 Asymptomatic menopausal state: Secondary | ICD-10-CM

## 2021-05-03 DIAGNOSIS — Z13820 Encounter for screening for osteoporosis: Secondary | ICD-10-CM

## 2021-05-12 ENCOUNTER — Ambulatory Visit: Payer: Self-pay

## 2021-05-12 ENCOUNTER — Other Ambulatory Visit: Payer: Self-pay

## 2021-05-12 ENCOUNTER — Encounter: Payer: Self-pay | Admitting: Physician Assistant

## 2021-05-12 ENCOUNTER — Ambulatory Visit (INDEPENDENT_AMBULATORY_CARE_PROVIDER_SITE_OTHER): Payer: No Typology Code available for payment source | Admitting: Physician Assistant

## 2021-05-12 ENCOUNTER — Ambulatory Visit
Admission: RE | Admit: 2021-05-12 | Discharge: 2021-05-12 | Disposition: A | Discharge status: Home / Self Care | Payer: No Typology Code available for payment source | Source: Ambulatory Visit | Attending: Family Medicine | Admitting: Family Medicine

## 2021-05-12 DIAGNOSIS — M25561 Pain in right knee: Secondary | ICD-10-CM

## 2021-05-12 DIAGNOSIS — Z780 Asymptomatic menopausal state: Secondary | ICD-10-CM

## 2021-05-12 DIAGNOSIS — M25551 Pain in right hip: Secondary | ICD-10-CM | POA: Diagnosis not present

## 2021-05-12 DIAGNOSIS — G8929 Other chronic pain: Secondary | ICD-10-CM | POA: Diagnosis not present

## 2021-05-12 DIAGNOSIS — Z13820 Encounter for screening for osteoporosis: Secondary | ICD-10-CM

## 2021-05-12 NOTE — Progress Notes (Signed)
Office Visit Note   Patient: Alyssa Wilson           Date of Birth: 10-06-1970           MRN: [id number] Visit Date: 05/12/2021              Requested by: Maurice Small, MD [address] [place],  [place] [id number] PCP: Maurice Small, MD   Assessment & Plan: Visit Diagnoses:  1. Pain in right hip   2. Chronic pain of right knee     Plan:  Given patient's continued on and off right knee pain with minimal arthritic changes on radiograph and the fact that she has giving way of the knee recommend MRI to rule out meniscal tear.  Have her follow-up after the MRI to go over results discuss further treatment.  Continue Tylenol in the interim.  Questions encouraged and answered.   Follow-Up Instructions: Return AFTER MRI.   Orders:  Orders Placed This Encounter  Procedures   XR HIP UNILAT W OR W/O PELVIS 2-3 VIEWS RIGHT   No orders of the defined types were placed in this encounter.     Procedures: No procedures performed   Clinical Data: No additional findings.   Subjective: Chief Complaint  Patient presents with   Right Hip - Pain   Right Knee - Pain    HPI Alyssa Wilson returns today due to right knee pain she was last seen on 12/30/2020.  She has had on and off knee pain for some time now.  She states that he did well until 2 weeks ago she developed severe pain in her knee that ran up to her groin area.  No known injury.  She notes that the pain that she had a hard time even ambulating and had to drag the leg.  She denies any numbness tingling down the leg.  She has been unable to take NSAIDs due to renal function has been taking Tylenol which helps some. Review of Systems No fevers or chills.  Positive for giving way of the right knee but otherwise no mechanical symptoms.  Objective: Vital Signs: LMP 03/17/2012   Physical Exam General: Well-developed well-nourished female no acute distress ambulates with a nonantalgic gait and no assistive device. Ortho  Exam Right knee good range of motion right knee.  She has tenderness along the medial joint line.  Slight hyperextension of the knee.  Patellofemoral crepitus.  No abnormal warmth erythema or effusion. Right hip excellent range of motion without pain.  Nontender over the trochanteric region. Specialty Comments:  No specialty comments available.  Imaging: XR HIP UNILAT W OR W/O PELVIS 2-3 VIEWS RIGHT  Result Date: 05/12/2021 AP pelvis lateral view of the right hip: Both hips well located.  No acute fractures.  Both hips are well preserved without significant arthritic changes or bony abnormalities.    PMFS History: Patient Active Problem List   Diagnosis Date Noted   CRI (chronic renal insufficiency), stage 1 10/21/2019   Pigmented skin lesion of uncertain nature 66/01/3015   GERD (gastroesophageal reflux disease) 09/02/2015   H. pylori infection 09/02/2015   Dyslipidemia 07/22/2013   Obesity 07/22/2013   ANXIETY 06/24/2010   ALLERGIC RHINITIS 02/02/2010   HUMAN PAPILLOMAVIRUS 02/05/2008   Depression 02/05/2008   TUBAL PREGNANCY 02/05/2008   PAP SMER CERV W/LW GRADE SQUAMOUS INTRAEPITH LES 02/05/2008   Human immunodeficiency virus (HIV) disease ([place]) 01/22/2008   Past Medical History:  Diagnosis Date   Anemia  Headache(784.0)    HIV (human immunodeficiency virus infection) ([place])    Morbid obesity ([place])     Family History  Problem Relation Age of Onset   Heart failure Mother    Stroke Mother    Diabetes Mother    Lupus Mother    Fibromyalgia Mother    Lupus Brother     Past Surgical History:  Procedure Laterality Date   ABDOMINAL HYSTERECTOMY     CERVICAL CONIZATION W/BX     LAPAROSCOPY ABDOMEN DIAGNOSTIC     ectopic pregnant  1998   Social History   Occupational History   Not on file  Tobacco Use   Smoking status: Never   Smokeless tobacco: Never  Vaping Use   Vaping Use: Never used  Substance and Sexual Activity   Alcohol use: No    Alcohol/week: 0.5  standard drinks    Types: 1 drink(s) per week   Drug use: No   Sexual activity: Yes    Comment: declined condoms

## 2021-05-13 NOTE — Addendum Note (Signed)
Addended by: Robyne Peers on: 05/13/2021 09:26 AM   Modules accepted: Orders

## 2021-05-29 ENCOUNTER — Other Ambulatory Visit: Payer: Self-pay

## 2021-05-29 ENCOUNTER — Ambulatory Visit
Admission: RE | Admit: 2021-05-29 | Discharge: 2021-05-29 | Disposition: A | Discharge status: Home / Self Care | Payer: No Typology Code available for payment source | Source: Ambulatory Visit | Attending: Physician Assistant | Admitting: Physician Assistant

## 2021-05-29 ENCOUNTER — Other Ambulatory Visit: Payer: No Typology Code available for payment source

## 2021-05-29 DIAGNOSIS — G8929 Other chronic pain: Secondary | ICD-10-CM

## 2021-05-31 ENCOUNTER — Ambulatory Visit: Payer: No Typology Code available for payment source | Admitting: Physician Assistant

## 2021-06-02 ENCOUNTER — Other Ambulatory Visit: Payer: Self-pay

## 2021-06-02 ENCOUNTER — Ambulatory Visit (INDEPENDENT_AMBULATORY_CARE_PROVIDER_SITE_OTHER): Payer: No Typology Code available for payment source | Admitting: Orthopaedic Surgery

## 2021-06-02 ENCOUNTER — Encounter: Payer: Self-pay | Admitting: Orthopaedic Surgery

## 2021-06-02 DIAGNOSIS — S83231D Complex tear of medial meniscus, current injury, right knee, subsequent encounter: Secondary | ICD-10-CM

## 2021-06-02 DIAGNOSIS — S83231A Complex tear of medial meniscus, current injury, right knee, initial encounter: Secondary | ICD-10-CM | POA: Insufficient documentation

## 2021-06-02 MED ORDER — ROSUVASTATIN CALCIUM 10 MG PO TABS: 10.0000 mg | ORAL_TABLET | Freq: Every day | ORAL | 0 refills | Status: DC

## 2021-06-02 NOTE — Progress Notes (Signed)
The patient is seen today to go over a MRI of her right knee.  She has been having locking and catching with that knee and had failed conservative treatment.  MRI was warranted at this point given the mechanical symptoms.  The MRI is reviewed with her of her right knee.  There is a complex posterior horn to mid body meniscal tear with a flap component.  There is only mild thinning of the cartilage in the medial compartment of her knee.  The remainder of the knee looks good on MRI.  On exam her signs and symptoms are consistent with a right knee medial meniscal tear.  There is medial joint line tenderness and a positive McMurray sign to the medial compartment.  She is still having locking and catching as well.  At this point we are recommending an arthroscopic intervention for her right knee.  I showed her a knee model and explained in detail what the surgery involves.  I talked about the interoperative and postoperative course following this type of surgery.  All questions and concerns were answered and addressed.  We will work on getting this scheduled per her request in the near future.  We would then see her back in 1 week postoperative.

## 2021-06-10 ENCOUNTER — Ambulatory Visit: Payer: No Typology Code available for payment source | Admitting: Physician Assistant

## 2021-06-11 ENCOUNTER — Encounter: Payer: Self-pay | Admitting: Orthopaedic Surgery

## 2021-06-24 ENCOUNTER — Other Ambulatory Visit: Payer: Self-pay | Admitting: Orthopaedic Surgery

## 2021-06-24 DIAGNOSIS — S83231D Complex tear of medial meniscus, current injury, right knee, subsequent encounter: Secondary | ICD-10-CM | POA: Diagnosis not present

## 2021-06-24 MED ORDER — HYDROCODONE-ACETAMINOPHEN 5-325 MG PO TABS: 1.0000 | ORAL_TABLET | Freq: Four times a day (QID) | ORAL | 0 refills | Status: DC | PRN

## 2021-06-30 ENCOUNTER — Ambulatory Visit (INDEPENDENT_AMBULATORY_CARE_PROVIDER_SITE_OTHER): Payer: No Typology Code available for payment source | Admitting: Orthopaedic Surgery

## 2021-06-30 ENCOUNTER — Encounter: Payer: Self-pay | Admitting: Orthopaedic Surgery

## 2021-06-30 ENCOUNTER — Other Ambulatory Visit: Payer: Self-pay

## 2021-06-30 DIAGNOSIS — Z9889 Other specified postprocedural states: Secondary | ICD-10-CM

## 2021-06-30 DIAGNOSIS — S83231D Complex tear of medial meniscus, current injury, right knee, subsequent encounter: Secondary | ICD-10-CM

## 2021-06-30 NOTE — Progress Notes (Signed)
The patient is an active 50 year old female who is following up for her first visit after having a right knee arthroscopy with partial medial meniscectomy.  She reports some swelling and just a little bit of soreness.  She feels like she is doing well overall.  I did go over arthroscopy pictures with her.  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  The meniscal tear was fortunately small.  Her lateral compartment was intact.  I did remove the sutures from her arthroscopy portal sites today and her right knee looks good.  There is only minimal effusion.  I will have her work on quad strengthening exercises as well as flexing and extending her knee.  I would like to see her back in 4 weeks to see how she is doing overall.  She is likely a candidate for hyaluronic acid at some point.  I have also advocated weight loss for her as well.  All question concerns were answered and addressed.

## 2021-07-21 ENCOUNTER — Ambulatory Visit: Payer: No Typology Code available for payment source | Admitting: Internal Medicine

## 2021-08-04 ENCOUNTER — Ambulatory Visit (INDEPENDENT_AMBULATORY_CARE_PROVIDER_SITE_OTHER): Payer: No Typology Code available for payment source | Admitting: Orthopaedic Surgery

## 2021-08-04 ENCOUNTER — Encounter: Payer: Self-pay | Admitting: Orthopaedic Surgery

## 2021-08-04 DIAGNOSIS — Z9889 Other specified postprocedural states: Secondary | ICD-10-CM

## 2021-08-04 NOTE — Progress Notes (Signed)
The patient is now about 6 weeks status post a right knee arthroscopy with a partial medial meniscectomy.  She did have some cartilage changes in the medial femoral condyle of her knee.  She says the knee is doing well and she has no issues with it whatsoever.  Examination of her right knee shows that both knees hyperextend.  The right operative knee shows no effusion and well-healed surgical incisions.  She has excellent range of motion of that knee with no pain.  She did have me look at an area on the bottom of her right foot that appears to have had a splinter at some point.  I did use a #10 blade to pare down the callus over this area and it smoothed out.  I did not find any splinter.  She did feel much better with weightbearing.  From a knee standpoint follow-up can be as needed.  If she has any issues she knows to let us know.  She may be a candidate at some point for hyaluronic acid for the knee.

## 2021-08-19 ENCOUNTER — Ambulatory Visit (INDEPENDENT_AMBULATORY_CARE_PROVIDER_SITE_OTHER): Payer: No Typology Code available for payment source | Admitting: Internal Medicine

## 2021-08-19 ENCOUNTER — Other Ambulatory Visit: Payer: Self-pay

## 2021-08-19 ENCOUNTER — Encounter: Payer: Self-pay | Admitting: Internal Medicine

## 2021-08-19 DIAGNOSIS — B20 Human immunodeficiency virus [HIV] disease: Secondary | ICD-10-CM

## 2021-08-19 MED ORDER — BICTEGRAVIR-EMTRICITAB-TENOFOV 50-200-25 MG PO TABS: 1.0000 | ORAL_TABLET | Freq: Every day | ORAL | 11 refills | Status: DC

## 2021-08-19 NOTE — Assessment & Plan Note (Signed)
Her infection remains under excellent, long-term control.  She will continue Biktarvy and follow-up in 1 year.

## 2021-08-19 NOTE — Progress Notes (Signed)
Patient Active Problem List   Diagnosis Date Noted   Human immunodeficiency virus (HIV) disease ([place]) 01/22/2008    Priority: High   Dyslipidemia 07/22/2013    Priority: Medium    Obesity 07/22/2013    Priority: Medium    Complex tear of medial meniscus of right knee as current injury 06/02/2021   CRI (chronic renal insufficiency), stage 1 10/21/2019   Pigmented skin lesion of uncertain nature 61/44/3154   GERD (gastroesophageal reflux disease) 09/02/2015   H. pylori infection 09/02/2015   ANXIETY 06/24/2010   ALLERGIC RHINITIS 02/02/2010   HUMAN PAPILLOMAVIRUS 02/05/2008   Depression 02/05/2008   TUBAL PREGNANCY 02/05/2008   PAP SMER CERV W/LW GRADE SQUAMOUS INTRAEPITH LES 02/05/2008    Patient's Medications  New Prescriptions   No medications on file  Previous Medications   ASCORBIC ACID (VITAMIN C) 500 MG CHEW    Chew by mouth daily.   CHOLECALCIFEROL (VITAMIN D3 PO)    Take by mouth daily.   HYDROCODONE-ACETAMINOPHEN (NORCO/VICODIN) 5-325 MG TABLET    Take 1-2 tablets by mouth every 6 (six) hours as needed for moderate pain.   ROSUVASTATIN (CRESTOR) 10 MG TABLET    Take 1 tablet (10 mg total) by mouth daily. Please call to schedule appointment for future refills. 1st attempt  Modified Medications   Modified Medication Previous Medication   BICTEGRAVIR-EMTRICITABINE-TENOFOVIR AF (BIKTARVY) 50-200-25 MG TABS TABLET bictegravir-emtricitabine-tenofovir AF (BIKTARVY) 50-200-25 MG TABS tablet      Take 1 tablet by mouth daily.    Take 1 tablet by mouth daily.  Discontinued Medications   No medications on file    Subjective: Alyssa Wilson is in for her routine HIV follow-up visit.  She has not had any problems obtaining, taking or tolerating her Biktarvy and she recalls missing only 1 dose recently.  She is feeling well.  She denies feeling anxious or depressed.  She is up-to-date on her influenza and COVID vaccines.  Review of Systems: Review of Systems   Constitutional:  Negative for fever and weight loss.  Psychiatric/Behavioral:  Negative for depression. The patient is not nervous/anxious.    Past Medical History:  Diagnosis Date   Anemia    Headache(784.0)    HIV (human immunodeficiency virus infection) ([place])    Morbid obesity ([place])     Social History   Tobacco Use   Smoking status: Never   Smokeless tobacco: Never  Vaping Use   Vaping Use: Never used  Substance Use Topics   Alcohol use: No    Alcohol/week: 0.5 standard drinks    Types: 1 drink(s) per week   Drug use: No    Family History  Problem Relation Age of Onset   Heart failure Mother    Stroke Mother    Diabetes Mother    Lupus Mother    Fibromyalgia Mother    Lupus Brother     No Known Allergies  Health Maintenance  Topic Date Due   Zoster Vaccines- Shingrix (1 of 2) Never done   COLONOSCOPY (Pts 45-61yrs Insurance coverage will need to be confirmed)  Never done   COVID-19 Vaccine (5 - Booster for Pfizer series) 01/16/2021   MAMMOGRAM  12/08/2022   TETANUS/TDAP  05/01/2031   Pneumococcal Vaccine 54-80 Years old (2 - PPSV23 if available, else PCV20) 08/08/2034   INFLUENZA VACCINE  Completed   Hepatitis C Screening  Completed   HIV Screening  Completed   HPV VACCINES  Aged Out  Objective:  Vitals:   08/19/21 0859  BP: 113/78  Pulse: 78  Temp: (!) 96.7 F (35.9 C)  TempSrc: Temporal  Weight: 210 lb (95.3 kg)   Body mass index is 38.41 kg/m.  Physical Exam Constitutional:      Comments: She is very pleasant.  She has gained weight.  Cardiovascular:     Rate and Rhythm: Normal rate and regular rhythm.     Heart sounds: No murmur heard. Pulmonary:     Effort: Pulmonary effort is normal.     Breath sounds: Normal breath sounds.  Psychiatric:        Mood and Affect: Mood normal.    Lab Results Lab Results  Component Value Date   WBC 6.3 12/29/2020   HGB 12.8 12/29/2020   HCT 38.8 12/29/2020   MCV 81.3 12/29/2020   PLT 132  (L) 12/29/2020    Lab Results  Component Value Date   CREATININE 1.00 12/29/2020   BUN 15 12/29/2020   NA 140 12/29/2020   K 4.1 12/29/2020   CL 105 12/29/2020   CO2 28 12/29/2020    Lab Results  Component Value Date   ALT 26 12/29/2020   AST 19 12/29/2020   ALKPHOS 82 11/07/2016   BILITOT 0.4 12/29/2020    Lab Results  Component Value Date   CHOL 186 12/29/2020   HDL 68 12/29/2020   LDLCALC 101 (H) 12/29/2020   TRIG 76 12/29/2020   CHOLHDL 2.7 12/29/2020   Lab Results  Component Value Date   LABRPR NON-REACTIVE 12/29/2020   HIV 1 RNA Quant  Date Value  12/29/2020 47 Copies/mL (H)  10/09/2019 <20 DETECTED copies/mL (A)  09/24/2018 <20 NOT DETECTED copies/mL   CD4 T Cell Abs (/uL)  Date Value  12/29/2020 850  10/09/2019 1,211  09/24/2018 890   She had lab work done at an outside lab recently CD4 749 HIV viral load less than 20 Problem List Items Addressed This Visit       High   Human immunodeficiency virus (HIV) disease ([place])    Her infection remains under excellent, long-term control.  She will continue Biktarvy and follow-up in 1 year.      Relevant Medications   bictegravir-emtricitabine-tenofovir AF (BIKTARVY) 50-200-25 MG TABS tablet      Michel Bickers, MD [facility] [phone] pager   [phone] cell 08/19/2021, 9:20 AM

## 2021-11-25 ENCOUNTER — Other Ambulatory Visit: Payer: Self-pay | Admitting: Family Medicine

## 2021-11-25 DIAGNOSIS — Z1231 Encounter for screening mammogram for malignant neoplasm of breast: Secondary | ICD-10-CM

## 2021-12-08 ENCOUNTER — Ambulatory Visit
Admission: RE | Admit: 2021-12-08 | Discharge: 2021-12-08 | Disposition: A | Discharge status: Home / Self Care | Payer: No Typology Code available for payment source | Source: Ambulatory Visit | Attending: Family Medicine | Admitting: Family Medicine

## 2021-12-08 DIAGNOSIS — Z1231 Encounter for screening mammogram for malignant neoplasm of breast: Secondary | ICD-10-CM

## 2022-01-17 ENCOUNTER — Other Ambulatory Visit: Payer: Self-pay | Admitting: Internal Medicine

## 2022-01-17 DIAGNOSIS — B20 Human immunodeficiency virus [HIV] disease: Secondary | ICD-10-CM

## 2022-04-13 ENCOUNTER — Ambulatory Visit (INDEPENDENT_AMBULATORY_CARE_PROVIDER_SITE_OTHER): Payer: No Typology Code available for payment source | Admitting: Orthopaedic Surgery

## 2022-04-13 ENCOUNTER — Ambulatory Visit (INDEPENDENT_AMBULATORY_CARE_PROVIDER_SITE_OTHER): Payer: No Typology Code available for payment source

## 2022-04-13 DIAGNOSIS — M7541 Impingement syndrome of right shoulder: Secondary | ICD-10-CM | POA: Diagnosis not present

## 2022-04-13 DIAGNOSIS — M25511 Pain in right shoulder: Secondary | ICD-10-CM | POA: Diagnosis not present

## 2022-04-13 DIAGNOSIS — G8929 Other chronic pain: Secondary | ICD-10-CM | POA: Diagnosis not present

## 2022-04-13 MED ORDER — METHYLPREDNISOLONE ACETATE 40 MG/ML IJ SUSP
40.0000 mg | INTRAMUSCULAR | Status: AC | PRN
  Administered 2022-04-13: 40 mg via INTRA_ARTICULAR

## 2022-04-13 MED ORDER — LIDOCAINE HCL 1 % IJ SOLN
3.0000 mL | INTRAMUSCULAR | Status: AC | PRN
  Administered 2022-04-13: 3 mL

## 2022-04-13 NOTE — Progress Notes (Signed)
Office Visit Note   Patient: Alyssa Wilson           Date of Birth: [birth year]           MRN: [id number] Visit Date: 04/13/2022              Requested by: Maurice Small, MD [place],  [place] [address] PCP: Maurice Small, MD   Assessment & Plan: Visit Diagnoses:  1. Chronic right shoulder pain   2. Impingement syndrome of right shoulder     Plan: She is showing signs of tendinitis and impingement of the right shoulder.  I recommended a subacromial steroid injection with a steroid and she agrees to this and tolerated it well.  She will work on a home exercise program and keep her shoulder moving.  If this helps that will be great.  If she does have return of her pain she will let us know because my next step would be considering a MRI of the right shoulder.  All question concerns were answered and addressed.  Follow-Up Instructions: Return if symptoms worsen or fail to improve.   Orders:  Orders Placed This Encounter  Procedures   Large Joint Inj   XR Shoulder Right   No orders of the defined types were placed in this encounter.     Procedures: Large Joint Inj: R subacromial bursa on 04/13/2022 10:56 AM Indications: pain and diagnostic evaluation Details: 22 G 1.5 in needle  Arthrogram: No  Medications: 3 mL lidocaine 1 %; 40 mg methylPREDNISolone acetate 40 MG/ML Outcome: tolerated well, no immediate complications Procedure, treatment alternatives, risks and benefits explained, specific risks discussed. Consent was given by the patient. Immediately prior to procedure a time out was called to verify the correct patient, procedure, equipment, support staff and site/side marked as required. Patient was prepped and draped in the usual sterile fashion.        Clinical Data: No additional findings.   Subjective: Chief Complaint  Patient presents with   Right Shoulder - Pain  The patient is well-known to me.  She comes in with a 25-monthhistory of right shoulder pain with no known injury.  She says overhead activities and reaching behind her very painful and it does wake her up at night.  She has tried activity modification and rest as well as anti-inflammatories.  She is never had surgery on the shoulder.  She denies any radicular complaints or neck pain.  HPI  Review of Systems There is currently listed no fever, chills, nausea, vomiting  Objective: Vital Signs: LMP 03/17/2012   Physical Exam Is alert and oriented x3 and in no acute distress Ortho Exam She does have good range of motion of her right shoulder but is very painful especially reaching behind her and overhead.  She has crossarm pain as well.  Her rotator cuff itself is  strong and the shoulder is well located. Specialty Comments:  No specialty comments available.  Imaging: XR Shoulder Right  Result Date: 04/13/2022 3 views of the right shoulder show no acute findings.  The shoulder is well located.  There is slight decrease in subacromial outlet.    PMFS History: Patient Active Problem List   Diagnosis Date Noted   Complex tear of medial meniscus of right knee as current injury 06/02/2021   CRI (chronic renal insufficiency), stage 1 10/21/2019   Pigmented skin lesion of uncertain nature 01/41/0301   GERD (gastroesophageal reflux disease) 09/02/2015   H. pylori infection 09/02/2015   Dyslipidemia 07/22/2013   Obesity 07/22/2013   ANXIETY 06/24/2010   ALLERGIC RHINITIS 02/02/2010   HUMAN PAPILLOMAVIRUS 02/05/2008   Depression 02/05/2008   TUBAL PREGNANCY 02/05/2008   PAP SMER CERV W/LW GRADE SQUAMOUS INTRAEPITH LES 02/05/2008   Human immunodeficiency virus (HIV) disease ([place]) 01/22/2008   Past Medical History:  Diagnosis Date   Anemia     Headache(784.0)    HIV (human immunodeficiency virus infection) ([place])    Morbid obesity ([place])     Family History  Problem Relation Age of Onset   Heart failure Mother    Stroke Mother    Diabetes Mother    Lupus Mother    Fibromyalgia Mother    Lupus Brother     Past Surgical History:  Procedure Laterality Date   ABDOMINAL HYSTERECTOMY     CERVICAL CONIZATION W/BX     LAPAROSCOPY ABDOMEN DIAGNOSTIC     ectopic pregnant  1998   Social History   Occupational History   Not on file  Tobacco Use   Smoking status: Never   Smokeless tobacco: Never  Vaping Use   Vaping Use: Never used  Substance and Sexual Activity   Alcohol use: No    Alcohol/week: 0.5 standard drinks of alcohol    Types: 1 drink(s) per week   Drug use: No   Sexual activity: Yes    Comment: declined condoms

## 2022-05-10 ENCOUNTER — Other Ambulatory Visit: Payer: Self-pay | Admitting: Internal Medicine

## 2022-05-10 DIAGNOSIS — R1310 Dysphagia, unspecified: Secondary | ICD-10-CM

## 2022-05-10 DIAGNOSIS — E0580 Other thyrotoxicosis without thyrotoxic crisis or storm: Secondary | ICD-10-CM

## 2022-05-12 ENCOUNTER — Ambulatory Visit
Admission: RE | Admit: 2022-05-12 | Discharge: 2022-05-12 | Disposition: A | Discharge status: Home / Self Care | Payer: No Typology Code available for payment source | Source: Ambulatory Visit | Attending: Internal Medicine | Admitting: Internal Medicine

## 2022-05-12 DIAGNOSIS — R1310 Dysphagia, unspecified: Secondary | ICD-10-CM

## 2022-05-12 DIAGNOSIS — E0580 Other thyrotoxicosis without thyrotoxic crisis or storm: Secondary | ICD-10-CM

## 2022-06-22 ENCOUNTER — Ambulatory Visit: Payer: No Typology Code available for payment source | Admitting: Orthopaedic Surgery

## 2022-07-06 ENCOUNTER — Ambulatory Visit (INDEPENDENT_AMBULATORY_CARE_PROVIDER_SITE_OTHER): Payer: No Typology Code available for payment source

## 2022-07-06 ENCOUNTER — Telehealth: Payer: Self-pay

## 2022-07-06 ENCOUNTER — Encounter: Payer: Self-pay | Admitting: Orthopaedic Surgery

## 2022-07-06 ENCOUNTER — Ambulatory Visit (INDEPENDENT_AMBULATORY_CARE_PROVIDER_SITE_OTHER): Payer: No Typology Code available for payment source | Admitting: Orthopaedic Surgery

## 2022-07-06 DIAGNOSIS — M1711 Unilateral primary osteoarthritis, right knee: Secondary | ICD-10-CM

## 2022-07-06 DIAGNOSIS — M25561 Pain in right knee: Secondary | ICD-10-CM | POA: Diagnosis not present

## 2022-07-06 NOTE — Progress Notes (Signed)
The patient is an active 51 year old female well-known to me.  We performed arthroscopic surgery late last year on her right knee due to a complex medial meniscal tear.  There was thinning of the articular cartilage.  She had done well for a long period time but then was on a cruise recently.  She felt a pop with her knee got significant swelling after that with her knee.  She did wear a knee brace on the cruise afterwards and is feeling somewhat better now in terms of the pain in the knee.  It is still painful and the swelling is calm down.  Examination of her right knee does show mild effusion.  There is slight varus malalignment is correctable.  The knee feels ligamentously stable and has good range of motion.  2 views of the right knee shows moderate arthritis with medial joint space narrowing and varus malalignment as well as patellofemoral narrowing.  She is certainly a better candidate at this point for hyaluronic acid to treat the pain from osteoarthritis of her knee.  She defers any steroid injection and does prefer more long-term treatment options such as hyaluronic acid.  She has had other conservative treatment measures in the past including arthroscopic surgery and steroid injections.  We will work on getting her set up for hyaluronic acid for her right knee to treat the pain from osteoarthritis.

## 2022-07-06 NOTE — Telephone Encounter (Signed)
Right knee gel injection  

## 2022-07-07 NOTE — Telephone Encounter (Signed)
VOB submitted for Monovisc, right knee. 

## 2022-07-13 ENCOUNTER — Other Ambulatory Visit: Payer: Self-pay

## 2022-07-13 DIAGNOSIS — B20 Human immunodeficiency virus [HIV] disease: Secondary | ICD-10-CM

## 2022-07-13 DIAGNOSIS — Z113 Encounter for screening for infections with a predominantly sexual mode of transmission: Secondary | ICD-10-CM

## 2022-07-13 DIAGNOSIS — Z79899 Other long term (current) drug therapy: Secondary | ICD-10-CM

## 2022-07-14 ENCOUNTER — Other Ambulatory Visit: Payer: No Typology Code available for payment source

## 2022-07-28 ENCOUNTER — Ambulatory Visit: Payer: No Typology Code available for payment source | Admitting: Internal Medicine

## 2022-08-05 ENCOUNTER — Other Ambulatory Visit: Payer: Self-pay

## 2022-08-05 ENCOUNTER — Other Ambulatory Visit (HOSPITAL_COMMUNITY)
Admission: RE | Admit: 2022-08-05 | Discharge: 2022-08-05 | Disposition: A | Discharge status: Home / Self Care | Payer: No Typology Code available for payment source | Source: Ambulatory Visit | Attending: Internal Medicine | Admitting: Internal Medicine

## 2022-08-05 ENCOUNTER — Other Ambulatory Visit: Payer: No Typology Code available for payment source

## 2022-08-05 DIAGNOSIS — Z113 Encounter for screening for infections with a predominantly sexual mode of transmission: Secondary | ICD-10-CM | POA: Diagnosis present

## 2022-08-05 DIAGNOSIS — Z79899 Other long term (current) drug therapy: Secondary | ICD-10-CM

## 2022-08-05 DIAGNOSIS — B20 Human immunodeficiency virus [HIV] disease: Secondary | ICD-10-CM

## 2022-08-05 NOTE — Addendum Note (Signed)
Addended by: Tomi Bamberger on: 08/05/2022 08:44 AM   Modules accepted: Orders

## 2022-08-09 LAB — URINE CYTOLOGY ANCILLARY ONLY
Chlamydia: NEGATIVE
Comment: NEGATIVE
Comment: NORMAL
Neisseria Gonorrhea: NEGATIVE

## 2022-08-09 LAB — COMPLETE METABOLIC PANEL WITH GFR
AG Ratio: 1.4 (calc) (ref 1.0–2.5)
ALT: 19 U/L (ref 6–29)
AST: 17 U/L (ref 10–35)
Albumin: 4.4 g/dL (ref 3.6–5.1)
Alkaline phosphatase (APISO): 105 U/L (ref 37–153)
BUN: 12 mg/dL (ref 7–25)
CO2: 27 mmol/L (ref 20–32)
Calcium: 9.4 mg/dL (ref 8.6–10.4)
Chloride: 106 mmol/L (ref 98–110)
Creat: 1.03 mg/dL (ref 0.50–1.03)
Globulin: 3.2 g/dL (calc) (ref 1.9–3.7)
Glucose, Bld: 75 mg/dL (ref 65–99)
Potassium: 4.1 mmol/L (ref 3.5–5.3)
Sodium: 141 mmol/L (ref 135–146)
Total Bilirubin: 0.5 mg/dL (ref 0.2–1.2)
Total Protein: 7.6 g/dL (ref 6.1–8.1)
eGFR: 65 mL/min/{1.73_m2} (ref >=60)

## 2022-08-09 LAB — CBC WITH DIFFERENTIAL/PLATELET
Absolute Monocytes: 449 cells/uL (ref 200–950)
Basophils Absolute: 31 cells/uL (ref 0–200)
Basophils Relative: 0.6 %
Eosinophils Absolute: 31 cells/uL (ref 15–500)
Eosinophils Relative: 0.6 %
HCT: 37.9 % (ref 35.0–45.0)
Hemoglobin: 12.6 g/dL (ref 11.7–15.5)
Lymphs Abs: 2050 cells/uL (ref 850–3900)
MCH: 26.7 pg — ABNORMAL LOW (ref 27.0–33.0)
MCHC: 33.2 g/dL (ref 32.0–36.0)
MCV: 80.3 fL (ref 80.0–100.0)
MPV: 13.5 fL — ABNORMAL HIGH (ref 7.5–12.5)
Monocytes Relative: 8.8 %
Neutro Abs: 2540 cells/uL (ref 1500–7800)
Neutrophils Relative %: 49.8 %
Platelets: 128 10*3/uL — ABNORMAL LOW (ref 140–400)
RBC: 4.72 10*6/uL (ref 3.80–5.10)
RDW: 14.3 % (ref 11.0–15.0)
Total Lymphocyte: 40.2 %
WBC: 5.1 10*3/uL (ref 3.8–10.8)

## 2022-08-09 LAB — HIV-1 RNA QUANT-NO REFLEX-BLD
HIV 1 RNA Quant: <20 Copies/mL — ABNORMAL HIGH
HIV-1 RNA Quant, Log: <1.3 Log cps/mL — ABNORMAL HIGH

## 2022-08-09 LAB — LIPID PANEL
Cholesterol: 166 mg/dL (ref <200)
HDL: 64 mg/dL (ref >=50)
LDL Cholesterol (Calc): 89 mg/dL (calc)
Non-HDL Cholesterol (Calc): 102 mg/dL (calc) (ref <130)
Total CHOL/HDL Ratio: 2.6 (calc) (ref <5.0)
Triglycerides: 43 mg/dL (ref <150)

## 2022-08-09 LAB — T-HELPER CELLS (CD4) COUNT (NOT AT ARMC)
Absolute CD4: 918 cells/uL (ref 490–1740)
CD4 T Helper %: 40 % (ref 30–61)
Total lymphocyte count: 2287 cells/uL (ref 850–3900)

## 2022-08-09 LAB — RPR: RPR Ser Ql: NONREACTIVE

## 2022-08-18 ENCOUNTER — Encounter: Payer: Self-pay | Admitting: Internal Medicine

## 2022-08-18 ENCOUNTER — Other Ambulatory Visit: Payer: Self-pay

## 2022-08-18 ENCOUNTER — Ambulatory Visit (INDEPENDENT_AMBULATORY_CARE_PROVIDER_SITE_OTHER): Payer: No Typology Code available for payment source | Admitting: Internal Medicine

## 2022-08-18 VITALS — BP 121/83 | HR 70 | Temp 97.6°F | Resp 16 | Ht 62.0 in | Wt 177.4 lb

## 2022-08-18 DIAGNOSIS — B20 Human immunodeficiency virus [HIV] disease: Secondary | ICD-10-CM

## 2022-08-18 MED ORDER — BIKTARVY 50-200-25 MG PO TABS: ORAL_TABLET | ORAL | 11 refills | Status: DC

## 2022-08-18 NOTE — Progress Notes (Signed)
Patient Active Problem List   Diagnosis Date Noted   Human immunodeficiency virus (HIV) disease ([place]) 01/22/2008    Priority: High   Dyslipidemia 07/22/2013    Priority: Medium    Obesity 07/22/2013    Priority: Medium    Complex tear of medial meniscus of right knee as current injury 06/02/2021   CRI (chronic renal insufficiency), stage 1 10/21/2019   Pigmented skin lesion of uncertain nature 26/94/8546   GERD (gastroesophageal reflux disease) 09/02/2015   H. pylori infection 09/02/2015   ANXIETY 06/24/2010   ALLERGIC RHINITIS 02/02/2010   HUMAN PAPILLOMAVIRUS 02/05/2008   Depression 02/05/2008   TUBAL PREGNANCY 02/05/2008   PAP SMER CERV W/LW GRADE SQUAMOUS INTRAEPITH LES 02/05/2008    Patient's Medications  New Prescriptions   No medications on file  Previous Medications   ASCORBIC ACID (VITAMIN C) 500 MG CHEW    Chew by mouth daily.   CHOLECALCIFEROL (VITAMIN D3 PO)    Take by mouth daily.   HYDROCODONE-ACETAMINOPHEN (NORCO/VICODIN) 5-325 MG TABLET    Take 1-2 tablets by mouth every 6 (six) hours as needed for moderate pain.   ROSUVASTATIN (CRESTOR) 10 MG TABLET    Take 1 tablet (10 mg total) by mouth daily. Please call to schedule appointment for future refills. 1st attempt   WEGOVY 2.4 MG/0.75ML SOAJ      Modified Medications   Modified Medication Previous Medication   BICTEGRAVIR-EMTRICITABINE-TENOFOVIR AF (BIKTARVY) 50-200-25 MG TABS TABLET BIKTARVY 50-200-25 MG TABS tablet      TAKE 1 TABLET BY MOUTH 1 TIME A DAY.    TAKE 1 TABLET BY MOUTH 1 TIME A DAY.  Discontinued Medications   No medications on file    Subjective: Alyssa Wilson is in for her routine HIV follow-up visit.  She has not had any problems obtaining, taking or tolerating her Biktarvy and never misses doses.  She takes it when she first gets up.  She recently started on Wegovy and has lost significant weight.  She is hoping to get down to 165 pounds.  She is walking and even running some now.  She  is not feeling anxious or depressed.  She has had her annual influenza vaccine and an updated COVID-vaccine.  Review of Systems: Review of Systems  Constitutional:  Positive for weight loss. Negative for fever.  Psychiatric/Behavioral:  Negative for depression. The patient is not nervous/anxious.     Past Medical History:  Diagnosis Date   Anemia    Headache(784.0)    HIV (human immunodeficiency virus infection) ([place])    Morbid obesity ([place])     Social History   Tobacco Use   Smoking status: Never   Smokeless tobacco: Never  Vaping Use   Vaping Use: Never used  Substance Use Topics   Alcohol use: No    Alcohol/week: 0.5 standard drinks of alcohol    Types: 1 drink(s) per week   Drug use: No    Family History  Problem Relation Age of Onset   Heart failure Mother    Stroke Mother    Diabetes Mother    Lupus Mother    Fibromyalgia Mother    Lupus Brother     No Known Allergies  Health Maintenance  Topic Date Due   Zoster Vaccines- Shingrix (1 of 2) Never done   COLONOSCOPY (Pts 45-25yr Insurance coverage will need to be confirmed)  Never done   INFLUENZA VACCINE  03/08/2022   COVID-19 Vaccine (5 - 2023-24  season) 04/08/2022   MAMMOGRAM  12/09/2023   DTaP/Tdap/Td (4 - Td or Tdap) 05/01/2031   Hepatitis C Screening  Completed   HIV Screening  Completed   HPV VACCINES  Aged Out    Objective:  Vitals:   08/18/22 0854  BP: 121/83  Pulse: 70  Resp: 16  Temp: 97.6 F (36.4 C)  TempSrc: Oral  SpO2: 100%  Weight: 177 lb 6.4 oz (80.5 kg)  Height: '5\' 2"'$  (1.575 m)   Body mass index is 32.45 kg/m.  Physical Exam Constitutional:      Comments: She is in very good spirits as usual.  Cardiovascular:     Rate and Rhythm: Normal rate and regular rhythm.     Heart sounds: No murmur heard. Pulmonary:     Effort: Pulmonary effort is normal.     Breath sounds: Normal breath sounds.  Psychiatric:        Mood and Affect: Mood normal.     Lab Results Lab  Results  Component Value Date   WBC 5.1 08/05/2022   HGB 12.6 08/05/2022   HCT 37.9 08/05/2022   MCV 80.3 08/05/2022   PLT 128 (L) 08/05/2022    Lab Results  Component Value Date   CREATININE 1.03 08/05/2022   BUN 12 08/05/2022   NA 141 08/05/2022   K 4.1 08/05/2022   CL 106 08/05/2022   CO2 27 08/05/2022    Lab Results  Component Value Date   ALT 19 08/05/2022   AST 17 08/05/2022   ALKPHOS 82 11/07/2016   BILITOT 0.5 08/05/2022    Lab Results  Component Value Date   CHOL 166 08/05/2022   HDL 64 08/05/2022   LDLCALC 89 08/05/2022   TRIG 43 08/05/2022   CHOLHDL 2.6 08/05/2022   Lab Results  Component Value Date   LABRPR NON-REACTIVE 08/05/2022   HIV 1 RNA Quant  Date Value  08/05/2022 <20 Copies/mL (H)  12/29/2020 47 Copies/mL (H)  10/09/2019 <20 DETECTED copies/mL (A)   CD4 T Cell Abs (/uL)  Date Value  12/29/2020 850  10/09/2019 1,211  09/24/2018 890     Problem List Items Addressed This Visit       High   Human immunodeficiency virus (HIV) disease ([place]) - Primary    Her infection remains under excellent, long-term control.  She will continue Biktarvy and follow-up after lab work in 1 year.      Relevant Medications   bictegravir-emtricitabine-tenofovir AF (BIKTARVY) 50-200-25 MG TABS tablet   Other Relevant Orders   CBC   T-helper cells (CD4) count (not at [facility])   Comprehensive metabolic panel   Lipid panel   RPR   HIV-1 RNA quant-no reflex-bld      Michel Bickers, MD [facility] [place] [phone] pager   [phone] cell 08/18/2022, 9:44 AM

## 2022-08-18 NOTE — Assessment & Plan Note (Signed)
Her infection remains under excellent, long-term control.  She will continue Biktarvy and follow-up after lab work in 1 year. 

## 2022-08-19 ENCOUNTER — Telehealth: Payer: Self-pay

## 2022-08-19 NOTE — Telephone Encounter (Signed)
Faxed completed PA form to [employer] at [phone] for Monovisc, right knee. PA pending 

## 2022-09-02 ENCOUNTER — Telehealth: Payer: Self-pay

## 2022-09-02 DIAGNOSIS — M1711 Unilateral primary osteoarthritis, right knee: Secondary | ICD-10-CM

## 2022-09-02 NOTE — Telephone Encounter (Signed)
Called and left a VM for patient to CB and schedule an appt.with Dr. Ninfa Linden or Artis Delay for gel injection.  See referral tab

## 2022-10-11 ENCOUNTER — Telehealth: Payer: Self-pay

## 2022-10-11 NOTE — Telephone Encounter (Signed)
Faxed completed PA form to [employer] at [phone] for Monovisc, right knee. PA pending

## 2022-10-25 ENCOUNTER — Other Ambulatory Visit: Payer: Self-pay | Admitting: Family Medicine

## 2022-10-25 DIAGNOSIS — Z1231 Encounter for screening mammogram for malignant neoplasm of breast: Secondary | ICD-10-CM

## 2022-12-06 ENCOUNTER — Other Ambulatory Visit: Payer: Self-pay

## 2022-12-06 DIAGNOSIS — B20 Human immunodeficiency virus [HIV] disease: Secondary | ICD-10-CM

## 2022-12-06 MED ORDER — BIKTARVY 50-200-25 MG PO TABS: ORAL_TABLET | ORAL | 1 refills | Status: DC

## 2022-12-12 ENCOUNTER — Ambulatory Visit
Admission: RE | Admit: 2022-12-12 | Discharge: 2022-12-12 | Disposition: A | Discharge status: Home / Self Care | Payer: No Typology Code available for payment source | Source: Ambulatory Visit | Attending: Family Medicine | Admitting: Family Medicine

## 2022-12-12 DIAGNOSIS — Z1231 Encounter for screening mammogram for malignant neoplasm of breast: Secondary | ICD-10-CM

## 2023-03-20 ENCOUNTER — Other Ambulatory Visit (HOSPITAL_COMMUNITY): Payer: Self-pay

## 2023-05-19 ENCOUNTER — Other Ambulatory Visit: Payer: Self-pay | Admitting: Family Medicine

## 2023-05-19 ENCOUNTER — Other Ambulatory Visit (HOSPITAL_COMMUNITY)
Admission: RE | Admit: 2023-05-19 | Discharge: 2023-05-19 | Disposition: A | Discharge status: Home / Self Care | Payer: No Typology Code available for payment source | Source: Ambulatory Visit | Attending: Family Medicine | Admitting: Family Medicine

## 2023-05-19 DIAGNOSIS — Z01419 Encounter for gynecological examination (general) (routine) without abnormal findings: Secondary | ICD-10-CM | POA: Diagnosis present

## 2023-05-22 LAB — CYTOLOGY - PAP
Comment: NEGATIVE
Diagnosis: NEGATIVE
High risk HPV: NEGATIVE

## 2023-05-24 ENCOUNTER — Other Ambulatory Visit: Payer: Self-pay

## 2023-05-24 DIAGNOSIS — B20 Human immunodeficiency virus [HIV] disease: Secondary | ICD-10-CM

## 2023-05-24 MED ORDER — BIKTARVY 50-200-25 MG PO TABS: ORAL_TABLET | ORAL | 1 refills | Status: DC

## 2023-05-31 ENCOUNTER — Encounter: Payer: Self-pay | Admitting: Orthopaedic Surgery

## 2023-05-31 ENCOUNTER — Ambulatory Visit: Payer: No Typology Code available for payment source | Admitting: Orthopaedic Surgery

## 2023-05-31 ENCOUNTER — Other Ambulatory Visit (INDEPENDENT_AMBULATORY_CARE_PROVIDER_SITE_OTHER): Payer: No Typology Code available for payment source

## 2023-05-31 DIAGNOSIS — M25512 Pain in left shoulder: Secondary | ICD-10-CM | POA: Diagnosis not present

## 2023-05-31 DIAGNOSIS — G8929 Other chronic pain: Secondary | ICD-10-CM | POA: Diagnosis not present

## 2023-05-31 MED ORDER — METHYLPREDNISOLONE ACETATE 40 MG/ML IJ SUSP
40.0000 mg | INTRAMUSCULAR | Status: AC | PRN
  Administered 2023-05-31: 40 mg via INTRA_ARTICULAR

## 2023-05-31 MED ORDER — LIDOCAINE HCL 1 % IJ SOLN
3.0000 mL | INTRAMUSCULAR | Status: AC | PRN
  Administered 2023-05-31: 3 mL

## 2023-05-31 NOTE — Progress Notes (Signed)
The patient is a 52 year old female well-known to [place].  She comes in with left shoulder pain today it is been hurting her for several months now.  She started to have problems reaching behind her and overhead.  She has had successful injections in her right shoulder before and she is asymptomatic with the right shoulder.  She feels like we have likely injected this left shoulder remotely.  She is not diabetic.  She tries to take Tylenol but now her liver enzymes are elevated and she cannot take Tylenol or other anti-inflammatories either.  She denies any specific injuries.  The pain does wake her up at night.  She is not a diabetic.  Examination of her left shoulder does show positive signs of impingement with positive Neer and Hawkins signs.  With the right shoulder and arm she reaches behind her easily.  With the left shoulder when she reaches behind her she barely gets above the waist level.  3 views left shoulder are normal.  The glenohumeral joint and AC joint are well located.  The relationship of all the bones look normal.  I did recommend a steroid injection in the left shoulder subacromial outlet which she tolerated well.  I would like to see her back in 4 weeks.  If she is not getting better I would like to obtain a MRI of his left shoulder.  I did show her shoulder exercises with external rotation to try against resistance.    Procedure Note  Patient: Alyssa Wilson             Date of Birth: Nov 17, 1970           MRN: [id number]             Visit Date: 05/31/2023  Procedures: Visit Diagnoses:  1. Chronic left shoulder pain     Large Joint Inj: L subacromial bursa on 05/31/2023 8:48 AM Indications: pain and diagnostic evaluation Details: 22 G 1.5 in needle  Arthrogram: No  Medications: 3 mL lidocaine 1 %; 40 mg methylPREDNISolone acetate 40 MG/ML Outcome: tolerated well, no immediate complications Procedure, treatment alternatives, risks and benefits explained, specific risks  discussed. Consent was given by the patient. Immediately prior to procedure a time out was called to verify the correct patient, procedure, equipment, support staff and site/side marked as required. Patient was prepped and draped in the usual sterile fashion.

## 2023-06-28 ENCOUNTER — Ambulatory Visit (INDEPENDENT_AMBULATORY_CARE_PROVIDER_SITE_OTHER): Payer: No Typology Code available for payment source | Admitting: Orthopaedic Surgery

## 2023-06-28 ENCOUNTER — Encounter: Payer: Self-pay | Admitting: Orthopaedic Surgery

## 2023-06-28 DIAGNOSIS — M25512 Pain in left shoulder: Secondary | ICD-10-CM

## 2023-06-28 DIAGNOSIS — G8929 Other chronic pain: Secondary | ICD-10-CM

## 2023-06-28 NOTE — Addendum Note (Signed)
Addended by: Mardene Celeste B on: 06/28/2023 04:25 PM   Modules accepted: Orders

## 2023-06-28 NOTE — Progress Notes (Signed)
The patient comes in today for continued follow-up for her left shoulder.  We have provided a steroid injection a month ago in the subacromial outlet of the shoulder.  That has helped some in terms of decreasing her pain but she still having a lot of weakness in her shoulder and catching sensations with pain as she performs certain motions of her left shoulder.  She is still having a hard time reaching behind her but does report some improvement.  Examination of her left shoulder still shows significant pain throughout the arc of motion of her shoulder.  Her positive Neer and Hawkins signs are still present and her internal rotation with adduction is to the lower lumbar spine.  That is improved some.  She will continue shoulder exercises that she has been performing all along that we demonstrated to her and she demonstrated these back.  At this point we do need to obtain an MRI of her left shoulder to assess the rotator cuff and other tissue given her continued pain and the failure of conservative treatment.  We will see her back once we have this MRI.  She agrees with this treatment plan.

## 2023-07-12 ENCOUNTER — Encounter: Payer: Self-pay | Admitting: Orthopaedic Surgery

## 2023-07-19 ENCOUNTER — Ambulatory Visit
Admission: RE | Admit: 2023-07-19 | Discharge: 2023-07-19 | Disposition: A | Discharge status: Home / Self Care | Payer: No Typology Code available for payment source | Source: Ambulatory Visit | Attending: Orthopaedic Surgery | Admitting: Orthopaedic Surgery

## 2023-07-19 DIAGNOSIS — G8929 Other chronic pain: Secondary | ICD-10-CM

## 2023-07-27 ENCOUNTER — Ambulatory Visit (INDEPENDENT_AMBULATORY_CARE_PROVIDER_SITE_OTHER): Payer: No Typology Code available for payment source | Admitting: Orthopaedic Surgery

## 2023-07-27 ENCOUNTER — Encounter: Payer: Self-pay | Admitting: Orthopaedic Surgery

## 2023-07-27 DIAGNOSIS — G8929 Other chronic pain: Secondary | ICD-10-CM

## 2023-07-27 DIAGNOSIS — M75122 Complete rotator cuff tear or rupture of left shoulder, not specified as traumatic: Secondary | ICD-10-CM | POA: Diagnosis not present

## 2023-07-27 DIAGNOSIS — M25512 Pain in left shoulder: Secondary | ICD-10-CM

## 2023-07-27 NOTE — Progress Notes (Signed)
The patient is a right-hand-dominant 52 year old active female who comes in today to go over a MRI of her left shoulder.  She is a well-known patient to me.  She has been dealing with left shoulder pain and weakness for some time now with no known injury.  She does report decreased motion and weakness of that shoulder.  We have seen her for this before and she has been trying exercise of her shoulder as well as activity modification.  We have tried steroid injections as well.  Eventually we needed to send her for a MRI based on her clinical exam findings combined with the failure conservative treatment.  Examination of her left shoulder still shows weakness with overhead activities and external rotation.  There is significant pain in the subacromial outlet.  MRI of her left shoulder does show severe tendinosis of the rotator cuff with partial to near full-thickness tear of the cuff itself.  Fortunately there is no muscle atrophy and no other significant findings other than the rotator cuff tear.  I did show her shoulder model and have recommended left shoulder arthroscopy with a rotator cuff repair.  I described the risks and benefits of the surgery and what to expect from an intraoperative and postoperative course as well as a therapy standpoint things.  All questions and concerns were answered and addressed.  She is interested in having this scheduled so we will work on it from our standpoint.  She does live in [place] and I told her to look into where she would like to have outpatient physical therapy and [place] following surgery.

## 2023-08-03 ENCOUNTER — Other Ambulatory Visit: Payer: Self-pay | Admitting: Radiology

## 2023-08-03 ENCOUNTER — Telehealth: Payer: Self-pay | Admitting: Orthopaedic Surgery

## 2023-08-03 DIAGNOSIS — G8929 Other chronic pain: Secondary | ICD-10-CM

## 2023-08-03 NOTE — Telephone Encounter (Signed)
Patient called and needs a prescription for PT in [place]. She doesn't want to go in [place]. Just to correct in her last summary he put right shoulder when its the left shoulder. CB#[phone]

## 2023-08-03 NOTE — Telephone Encounter (Signed)
Patient requested to be sent to [facility] in [facility] for physical therapy.  P: [phone] F: [phone]  I will send in referral, patient wanted it noted that she is left hand dominant vs. What is documented in the first line of her last office note.  She is also requesting how long will she be out of work that way she can start the process with work. Patient is aware that Dr. Magnus Ivan is out of office & is fine to wait on a response until he returns.

## 2023-08-15 ENCOUNTER — Other Ambulatory Visit: Payer: Self-pay

## 2023-08-15 ENCOUNTER — Telehealth: Payer: Self-pay

## 2023-08-15 ENCOUNTER — Other Ambulatory Visit: Payer: No Typology Code available for payment source

## 2023-08-15 DIAGNOSIS — B20 Human immunodeficiency virus [HIV] disease: Secondary | ICD-10-CM

## 2023-08-15 DIAGNOSIS — Z113 Encounter for screening for infections with a predominantly sexual mode of transmission: Secondary | ICD-10-CM

## 2023-08-15 NOTE — Telephone Encounter (Signed)
 Spoke with patient about scheduling left shoulder RC repair surgery on 08/24/23.  She wants to know how long she will be out of work.  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

## 2023-08-16 NOTE — Telephone Encounter (Signed)
 I called and left detailed voice mail with information.

## 2023-08-17 ENCOUNTER — Encounter: Payer: Self-pay | Admitting: Orthopaedic Surgery

## 2023-08-21 ENCOUNTER — Telehealth: Payer: Self-pay | Admitting: Orthopaedic Surgery

## 2023-08-21 NOTE — Telephone Encounter (Signed)
[employer] forms received. To [employer]. 

## 2023-08-22 ENCOUNTER — Other Ambulatory Visit: Payer: Self-pay

## 2023-08-22 ENCOUNTER — Other Ambulatory Visit: Payer: No Typology Code available for payment source

## 2023-08-22 DIAGNOSIS — Z113 Encounter for screening for infections with a predominantly sexual mode of transmission: Secondary | ICD-10-CM

## 2023-08-22 DIAGNOSIS — B20 Human immunodeficiency virus [HIV] disease: Secondary | ICD-10-CM

## 2023-08-24 ENCOUNTER — Other Ambulatory Visit: Payer: Self-pay | Admitting: Orthopaedic Surgery

## 2023-08-24 DIAGNOSIS — M7542 Impingement syndrome of left shoulder: Secondary | ICD-10-CM | POA: Diagnosis not present

## 2023-08-24 LAB — HIV-1 RNA QUANT-NO REFLEX-BLD
HIV 1 RNA Quant: 30 {copies}/mL — ABNORMAL HIGH
HIV-1 RNA Quant, Log: 1.48 {Log} — ABNORMAL HIGH

## 2023-08-24 LAB — CBC WITH DIFFERENTIAL/PLATELET
Absolute Lymphocytes: 1994 {cells}/uL (ref 850–3900)
Absolute Monocytes: 383 {cells}/uL (ref 200–950)
Basophils Absolute: 51 {cells}/uL (ref 0–200)
Basophils Relative: 1 %
Eosinophils Absolute: 31 {cells}/uL (ref 15–500)
Eosinophils Relative: 0.6 %
HCT: 39 % (ref 35.0–45.0)
Hemoglobin: 12.9 g/dL (ref 11.7–15.5)
MCH: 27.2 pg (ref 27.0–33.0)
MCHC: 33.1 g/dL (ref 32.0–36.0)
MCV: 82.3 fL (ref 80.0–100.0)
MPV: 11.6 fL (ref 7.5–12.5)
Monocytes Relative: 7.5 %
Neutro Abs: 2642 {cells}/uL (ref 1500–7800)
Neutrophils Relative %: 51.8 %
Platelets: 148 Thousand/uL (ref 140–400)
RBC: 4.74 Million/uL (ref 3.80–5.10)
RDW: 13.7 % (ref 11.0–15.0)
Total Lymphocyte: 39.1 %
WBC: 5.1 Thousand/uL (ref 3.8–10.8)

## 2023-08-24 LAB — COMPLETE METABOLIC PANEL WITHOUT GFR
AG Ratio: 1.6 (calc) (ref 1.0–2.5)
ALT: 96 U/L — ABNORMAL HIGH (ref 6–29)
AST: 65 U/L — ABNORMAL HIGH (ref 10–35)
Albumin: 4.5 g/dL (ref 3.6–5.1)
Alkaline phosphatase (APISO): 178 U/L — ABNORMAL HIGH (ref 37–153)
BUN: 16 mg/dL (ref 7–25)
CO2: 27 mmol/L (ref 20–32)
Calcium: 9.4 mg/dL (ref 8.6–10.4)
Chloride: 105 mmol/L (ref 98–110)
Creat: 0.94 mg/dL (ref 0.50–1.03)
Globulin: 2.8 g/dL (ref 1.9–3.7)
Glucose, Bld: 80 mg/dL (ref 65–99)
Potassium: 4.3 mmol/L (ref 3.5–5.3)
Sodium: 140 mmol/L (ref 135–146)
Total Bilirubin: 0.5 mg/dL (ref 0.2–1.2)
Total Protein: 7.3 g/dL (ref 6.1–8.1)
eGFR: 72 mL/min/1.73m2

## 2023-08-24 LAB — LIPID PANEL
Cholesterol: 226 mg/dL — ABNORMAL HIGH (ref <200)
HDL: 75 mg/dL (ref >=50)
LDL Cholesterol (Calc): 136 mg/dL — ABNORMAL HIGH
Non-HDL Cholesterol (Calc): 151 mg/dL — ABNORMAL HIGH (ref <130)
Total CHOL/HDL Ratio: 3 (calc) (ref <5.0)
Triglycerides: 49 mg/dL (ref <150)

## 2023-08-24 LAB — T-HELPER CELLS (CD4) COUNT (NOT AT ARMC)
Absolute CD4: 888 {cells}/uL (ref 490–1740)
CD4 T Helper %: 44 % (ref 30–61)
Total lymphocyte count: 2021 {cells}/uL (ref 850–3900)

## 2023-08-24 LAB — RPR: RPR Ser Ql: NONREACTIVE

## 2023-08-24 MED ORDER — OXYCODONE HCL 5 MG PO TABS: 5.0000 mg | ORAL_TABLET | Freq: Four times a day (QID) | ORAL | 0 refills | Status: DC | PRN

## 2023-08-29 ENCOUNTER — Ambulatory Visit: Payer: Self-pay | Admitting: Infectious Diseases

## 2023-08-30 ENCOUNTER — Ambulatory Visit: Payer: No Typology Code available for payment source | Admitting: Infectious Diseases

## 2023-08-31 ENCOUNTER — Encounter: Payer: Self-pay | Admitting: Internal Medicine

## 2023-08-31 ENCOUNTER — Other Ambulatory Visit: Payer: Self-pay

## 2023-08-31 ENCOUNTER — Encounter: Payer: Self-pay | Admitting: Physician Assistant

## 2023-08-31 ENCOUNTER — Ambulatory Visit: Payer: No Typology Code available for payment source | Admitting: Internal Medicine

## 2023-08-31 ENCOUNTER — Ambulatory Visit: Payer: No Typology Code available for payment source | Admitting: Physician Assistant

## 2023-08-31 VITALS — BP 121/87 | HR 72 | Resp 16 | Ht 62.0 in | Wt 162.3 lb

## 2023-08-31 DIAGNOSIS — B20 Human immunodeficiency virus [HIV] disease: Secondary | ICD-10-CM

## 2023-08-31 DIAGNOSIS — Z6829 Body mass index (BMI) 29.0-29.9, adult: Secondary | ICD-10-CM

## 2023-08-31 DIAGNOSIS — Z9889 Other specified postprocedural states: Secondary | ICD-10-CM

## 2023-08-31 DIAGNOSIS — Z113 Encounter for screening for infections with a predominantly sexual mode of transmission: Secondary | ICD-10-CM

## 2023-08-31 DIAGNOSIS — E669 Obesity, unspecified: Secondary | ICD-10-CM | POA: Diagnosis not present

## 2023-08-31 NOTE — Patient Instructions (Signed)
Please continue to monitor every 3 months your liver function  If persistently elevated > 3 times probably could see GI  It is ok to continue monitoring off wegovy for now    Other things to do next visit: Do labs when you visit me, not before Will check hepatitis b immunity  See you in 6 months

## 2023-08-31 NOTE — Progress Notes (Signed)
HPI: Mrs. Schrupp returns today status post left shoulder arthroscopy with subacromial decompression and Parth show acromioplasty/debridement.  She states that she has had some pain particularly when trying to write with her left hand to raise her arm above her head.  She has been taking Tylenol and Motrin for the pain though.  No drainage no redness.  She notes her pain is worse when she tries to sleep.  This is 3 out of 10.  Physical exam: General Well-developed well-nourished female no acute distress mood and affect appropriate Left shoulder forward flexion actively to approximately 40 degrees passively 160.  Good range of motion of the left elbow without significant pain.  Port sites about the shoulder all well-approximated no signs of infection no drainage.  Impression: Status post left shoulder arthroscopy with debridement and subacromial decompression  Plan: She will begin therapy per her request in [facility] for range of motion strengthening shoulder.  She can discontinue the sling as she has been sleeping in it at night.  Sutures were harvested Steri-Strips applied.  She is able to get the incisions wet in the shower.  Did review the arthroscopy images with her today.  She will follow-up with [place] in 1 month see how she is doing overall.  Questions were encouraged and answered at length.

## 2023-08-31 NOTE — Progress Notes (Signed)
Patient Active Problem List   Diagnosis Date Noted   Complex tear of medial meniscus of right knee as current injury 06/02/2021   CRI (chronic renal insufficiency), stage 1 10/21/2019   Pigmented skin lesion of uncertain nature 03/08/2016   GERD (gastroesophageal reflux disease) 09/02/2015   H. pylori infection 09/02/2015   Dyslipidemia 07/22/2013   Obesity 07/22/2013   ANXIETY 06/24/2010   ALLERGIC RHINITIS 02/02/2010   HUMAN PAPILLOMAVIRUS 02/05/2008   Depression 02/05/2008   TUBAL PREGNANCY 02/05/2008   PAP SMER CERV W/LW GRADE SQUAMOUS INTRAEPITH LES 02/05/2008   Human immunodeficiency virus (HIV) disease ([facility]) 01/22/2008    Patient's Medications  New Prescriptions   No medications on file  Previous Medications   ASCORBIC ACID (VITAMIN C) 500 MG CHEW    Chew by mouth daily.   BICTEGRAVIR-EMTRICITABINE-TENOFOVIR AF (BIKTARVY) 50-200-25 MG TABS TABLET    TAKE 1 TABLET BY MOUTH 1 TIME A DAY.   CHOLECALCIFEROL (VITAMIN D3 PO)    Take by mouth daily.   OXYCODONE (OXY IR/ROXICODONE) 5 MG IMMEDIATE RELEASE TABLET    Take 1-2 tablets (5-10 mg total) by mouth every 6 (six) hours as needed for severe pain (pain score 7-10).   ROSUVASTATIN (CRESTOR) 10 MG TABLET    Take 1 tablet (10 mg total) by mouth daily. Please call to schedule appointment for future refills. 1st attempt   WEGOVY 2.4 MG/0.75ML SOAJ      Modified Medications   No medications on file  Discontinued Medications   No medications on file    Subjective: Patient previously has been seeing dr Orvan Falconer; last visit 08/2022  My first time seeing her today 08/31/23: Tolerating biktarvy no missed dose She has been taking wegovy (semaglutide) (previously marketted under ozempic) and has good result -- not taking past month due to lft elevation  Was taking otc liver cleanse but had stopped with the lft up  She has some lft elevation intermittently and worry about it. Nondrinker Reviewed immunization Had  shingrix/rsv with pcp Has hep b vaccine 2 shots within past 3-5 years  Lft -- negative acute hepatitis panel check from pcp the last few months    Review of Systems: Review of Systems  Constitutional:  Positive for weight loss. Negative for fever.  Psychiatric/Behavioral:  Negative for depression. The patient is not nervous/anxious.     Past Medical History:  Diagnosis Date   Anemia    Headache(784.0)    HIV (human immunodeficiency virus infection) (H[facility])    Morbid obesity (H[facility])     Social History   Tobacco Use   Smoking status: Never   Smokeless tobacco: Never  Vaping Use   Vaping status: Never Used  Substance Use Topics   Alcohol use: No    Alcohol/week: 0.5 standard drinks of alcohol    Types: 1 drink(s) per week   Drug use: No    Family History  Problem Relation Age of Onset   Heart failure Mother    Stroke Mother    Diabetes Mother    Lupus Mother    Fibromyalgia Mother    Lupus Brother     No Known Allergies  Health Maintenance  Topic Date Due   Zoster Vaccines- Shingrix (1 of 2) Never done   Colonoscopy  Never done   COVID-19 Vaccine (5 - 2024-25 season) 04/09/2023   MAMMOGRAM  12/11/2024   DTaP/Tdap/Td (4 - Td or Tdap) 05/01/2031   Pneumococcal Vaccine 78-58 Years old (4  of 4 - PPSV23 or PCV20) 08/08/2034   INFLUENZA VACCINE  Completed   Hepatitis C Screening  Completed   HIV Screening  Completed   HPV VACCINES  Aged Out    Objective:  Vitals:   08/31/23 1105  BP: 121/87  Pulse: 72  Resp: 16  Weight: 162 lb 4.8 oz (73.6 kg)  Height: 5\' 2"  (1.575 m)    There is no height or weight on file to calculate BMI.  Physical Exam Constitutional:      Comments: She is in very good spirits as usual.  Cardiovascular:     Rate and Rhythm: Normal rate and regular rhythm.     Heart sounds: No murmur heard. Pulmonary:     Effort: Pulmonary effort is normal.     Breath sounds: Normal breath sounds.  Psychiatric:        Mood and Affect: Mood  normal.     Lab Results Lab Results  Component Value Date   WBC 5.1 08/22/2023   HGB 12.9 08/22/2023   HCT 39.0 08/22/2023   MCV 82.3 08/22/2023   PLT 148 08/22/2023    Lab Results  Component Value Date   CREATININE 0.94 08/22/2023   BUN 16 08/22/2023   NA 140 08/22/2023   K 4.3 08/22/2023   CL 105 08/22/2023   CO2 27 08/22/2023    Lab Results  Component Value Date   ALT 96 (H) 08/22/2023   AST 65 (H) 08/22/2023   ALKPHOS 82 11/07/2016   BILITOT 0.5 08/22/2023    Lab Results  Component Value Date   CHOL 226 (H) 08/22/2023   HDL 75 08/22/2023   LDLCALC 136 (H) 08/22/2023   TRIG 49 08/22/2023   CHOLHDL 3.0 08/22/2023   Lab Results  Component Value Date   LABRPR NON-REACTIVE 08/22/2023   HIV 1 RNA Quant (Copies/mL)  Date Value  08/22/2023 30 (H)  08/05/2022 <20 (H)  12/29/2020 47 (H)   CD4 T Cell Abs (/uL)  Date Value  12/29/2020 850  10/09/2019 1,211  09/24/2018 890  08/2023 cd4 44% 890        Problem List Items Addressed This Visit     Obesity   Other Visit Diagnoses       HIV disease (HCC)    -  Primary       #hiv/aids Dx'ed 2009 Cd4 nadir 80 (9%) in 2009 Heterosexual No hx OI Therapy: Atripla --> genvoya -> biktarvy. No prior hx virologic failure     -discussed u=u -encourage compliance -continue current HIV medication -labs 6 months -f/u in 6 months   #social Married x14 years; husband seronegative No etoh   #lft elevation ?meds Vs  NASH  -repeat q3 months and see gi if persistently elevated -avoid otc "liver cleanse" -unclear if related to semaglutide, less likely, but ok to hold for now    #hcm -vaccination Meningococcal 10/2018 Prevnar13 02/2018 Tdap 2022 Shingrix -- had with pcp Rsv -- had with pcp -hepatitis 2009 hep b sAb, sAg & hep c Ab negative Will need to retest next visit  -tb screen Can retest next visit -std screen 08/2023 rpr nonreactive -cancer screen Colon cancer -- -women's health  deferred to primary care provider Pap -- normal 05/2023 Mammogram -- birad 1 (normal) 12/2022   Raymondo Band, MD [facility] [phone] pager   [phone] cell 08/31/2023, 11:02 AM

## 2023-09-14 ENCOUNTER — Telehealth: Payer: Self-pay | Admitting: Orthopaedic Surgery

## 2023-09-14 ENCOUNTER — Other Ambulatory Visit: Payer: Self-pay | Admitting: Physician Assistant

## 2023-09-14 MED ORDER — CYCLOBENZAPRINE HCL 10 MG PO TABS: 10.0000 mg | ORAL_TABLET | Freq: Every day | ORAL | 0 refills | Status: DC

## 2023-09-14 NOTE — Telephone Encounter (Signed)
 Patient called stating she was experiencing muscle spasms in left arm. She also was wondering when she would be able to return to work. [phone]

## 2023-09-26 ENCOUNTER — Telehealth: Payer: Self-pay | Admitting: Radiology

## 2023-09-26 ENCOUNTER — Encounter: Payer: Self-pay | Admitting: Radiology

## 2023-09-26 ENCOUNTER — Ambulatory Visit: Payer: No Typology Code available for payment source | Admitting: Infectious Diseases

## 2023-09-26 NOTE — Telephone Encounter (Signed)
FYI --- Received out of work form for patient. Spoke with patient, advised this form will not be filled out until re-evaluated at next appointment.  Rescheduled Thurs. 2/20 appointment due to weather. I did send patient a letter to continue out of work until new follow up for Mon. 2/24 and will hold form until appointment.

## 2023-09-28 ENCOUNTER — Encounter: Payer: No Typology Code available for payment source | Admitting: Physician Assistant

## 2023-10-02 ENCOUNTER — Ambulatory Visit (INDEPENDENT_AMBULATORY_CARE_PROVIDER_SITE_OTHER): Payer: No Typology Code available for payment source | Admitting: Physician Assistant

## 2023-10-02 DIAGNOSIS — Z9889 Other specified postprocedural states: Secondary | ICD-10-CM

## 2023-10-02 NOTE — Progress Notes (Signed)
 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  She states she is doing okay.  She continues to work with therapy and is doing a home exercise program.  She finds this beneficial.  She states that her port sites are all healing well.  She brings in today paperwork for return to work.  She is no longer taking any pain medicine.  She takes Flexeril on an as needed basis for left shoulder pain.   Physical exam: Left shoulder full forward flexion.  Decreased internal rotation left shoulder only able to come to the left buttocks area compared to right shoulder able to reach up into her lower thoracic spine area.  About 5 strength external and internal rotation against resistance bilaterally.  Slight weakness with empty can test on the left.  Impression: Status post left shoulder arthroscopy with debridement and subacromial decompression  Plan: She will continue to work on range of motion strengthening left shoulder.  She will follow-up with [place] in 1 month.  Sooner if there is any questions concerns.  Paperwork was filled out today for her to return to work as of today full duties without restrictions.

## 2023-10-30 ENCOUNTER — Ambulatory Visit (INDEPENDENT_AMBULATORY_CARE_PROVIDER_SITE_OTHER): Payer: No Typology Code available for payment source | Admitting: Orthopaedic Surgery

## 2023-10-30 ENCOUNTER — Encounter: Payer: Self-pay | Admitting: Orthopaedic Surgery

## 2023-10-30 DIAGNOSIS — Z9889 Other specified postprocedural states: Secondary | ICD-10-CM

## 2023-10-30 NOTE — Progress Notes (Signed)
 The patient is about 2 months out from a left shoulder arthroscopy with subacromial decompression.  I did review her scope pictures again today before seeing her and there was a significant amount of inflamed tissue in the shoulder.  She said the shoulder is doing well she has a little bit of pain in the deltoid area.  She is still in physical therapy but has been out of therapy for few weeks just to see if she could do it on her own.  She has another therapy visit coming up.  She is back to work.  She is 53 years old.  Her biggest problem is being able to reach up behind her fully.  She still does have limitations with reaching behind her but overall her motion is good.  There is a little bit of subdeltoid pain but overall the shoulder looks better.  She will continue trying to push her sets of therapy.  Will see her back for final visit in 4 weeks and we told her to consider a steroid injection in the subacromial and subdeltoid area at that visit if she still having issues.

## 2023-11-27 ENCOUNTER — Other Ambulatory Visit: Payer: Self-pay | Admitting: Family Medicine

## 2023-11-27 DIAGNOSIS — Z0000 Encounter for general adult medical examination without abnormal findings: Secondary | ICD-10-CM

## 2023-12-01 ENCOUNTER — Other Ambulatory Visit: Payer: Self-pay | Admitting: Infectious Diseases

## 2023-12-01 DIAGNOSIS — B20 Human immunodeficiency virus [HIV] disease: Secondary | ICD-10-CM

## 2023-12-04 ENCOUNTER — Ambulatory Visit: Admitting: Orthopaedic Surgery

## 2023-12-13 ENCOUNTER — Ambulatory Visit

## 2023-12-19 NOTE — Progress Notes (Signed)
 The 10-year ASCVD risk score (Arnett DK, et al., 2019) is: 1.3%   Values used to calculate the score:     Age: 53 years     Sex: Female     Is Non-Hispanic African American: Yes     Diabetic: No     Tobacco smoker: No     Systolic Blood Pressure: 108 mmHg     Is BP treated: No     HDL Cholesterol: 75 mg/dL     Total Cholesterol: 226 mg/dL  Arlon Bergamo, BSN, RN

## 2023-12-26 ENCOUNTER — Ambulatory Visit
Admission: RE | Admit: 2023-12-26 | Discharge: 2023-12-26 | Disposition: A | Discharge status: Home / Self Care | Source: Ambulatory Visit | Attending: Family Medicine | Admitting: Family Medicine

## 2023-12-26 DIAGNOSIS — Z0000 Encounter for general adult medical examination without abnormal findings: Secondary | ICD-10-CM

## 2024-02-07 ENCOUNTER — Other Ambulatory Visit: Payer: Self-pay

## 2024-02-07 DIAGNOSIS — B20 Human immunodeficiency virus [HIV] disease: Secondary | ICD-10-CM

## 2024-02-13 ENCOUNTER — Other Ambulatory Visit: Payer: Self-pay

## 2024-02-13 ENCOUNTER — Other Ambulatory Visit: Payer: No Typology Code available for payment source

## 2024-02-13 DIAGNOSIS — B20 Human immunodeficiency virus [HIV] disease: Secondary | ICD-10-CM

## 2024-02-13 LAB — T-HELPER CELL (CD4) - (RCID CLINIC ONLY)
CD4 % Helper T Cell: 43 % (ref 33–65)
CD4 T Cell Abs: 785 /uL (ref 400–1790)

## 2024-02-15 LAB — HIV-1 RNA QUANT-NO REFLEX-BLD
HIV 1 RNA Quant: NOT DETECTED {copies}/mL
HIV-1 RNA Quant, Log: NOT DETECTED {Log_copies}/mL

## 2024-02-27 ENCOUNTER — Encounter: Payer: Self-pay | Admitting: Infectious Diseases

## 2024-02-27 ENCOUNTER — Other Ambulatory Visit (HOSPITAL_COMMUNITY): Payer: Self-pay

## 2024-02-27 ENCOUNTER — Other Ambulatory Visit (HOSPITAL_COMMUNITY)
Admission: RE | Admit: 2024-02-27 | Discharge: 2024-02-27 | Disposition: A | Discharge status: Home / Self Care | Source: Ambulatory Visit | Attending: Infectious Diseases | Admitting: Infectious Diseases

## 2024-02-27 ENCOUNTER — Ambulatory Visit: Payer: Self-pay | Admitting: Infectious Diseases

## 2024-02-27 ENCOUNTER — Telehealth: Payer: Self-pay

## 2024-02-27 ENCOUNTER — Telehealth: Payer: Self-pay | Admitting: Pharmacist

## 2024-02-27 ENCOUNTER — Other Ambulatory Visit: Payer: Self-pay

## 2024-02-27 VITALS — BP 119/80 | HR 74 | Temp 97.6°F | Ht 62.0 in | Wt 167.0 lb

## 2024-02-27 DIAGNOSIS — F419 Anxiety disorder, unspecified: Secondary | ICD-10-CM | POA: Insufficient documentation

## 2024-02-27 DIAGNOSIS — B20 Human immunodeficiency virus [HIV] disease: Secondary | ICD-10-CM | POA: Diagnosis not present

## 2024-02-27 DIAGNOSIS — Z113 Encounter for screening for infections with a predominantly sexual mode of transmission: Secondary | ICD-10-CM | POA: Diagnosis present

## 2024-02-27 DIAGNOSIS — Z0000 Encounter for general adult medical examination without abnormal findings: Secondary | ICD-10-CM | POA: Insufficient documentation

## 2024-02-27 DIAGNOSIS — E785 Hyperlipidemia, unspecified: Secondary | ICD-10-CM

## 2024-02-27 MED ORDER — CABOTEGRAVIR & RILPIVIRINE ER 600 & 900 MG/3ML IM SUER: 1.0000 | INTRAMUSCULAR | 5 refills | Status: AC

## 2024-02-27 MED ORDER — CABOTEGRAVIR & RILPIVIRINE ER 600 & 900 MG/3ML IM SUER: 1.0000 | INTRAMUSCULAR | 1 refills | Status: AC

## 2024-02-27 MED ORDER — BIKTARVY 50-200-25 MG PO TABS: ORAL_TABLET | ORAL | 5 refills | Status: DC

## 2024-02-27 NOTE — Telephone Encounter (Signed)
 Alyssa Wilson/[name] - can you investigate insurance coverage for Cabenuva ? Thank you!   Counseled that Cabenuva  is two separate intramuscular injections in the gluteal muscle on each side for each visit. Explained that the second injection is 30 days after the initial injection then every 2 months thereafter. Discussed the need for viral load monitoring every 2 months for the first 6 months and then periodically afterwards as their provider sees the need. Discussed the rare but significant chance of developing resistance despite compliance. Explained that showing up to injection appointments is very important and warned that if 2 appointments are missed, it will be reassessed by their provider whether they are a good candidate for injection therapy. Counseled on possible side effects associated with the injections such as injection site pain, which is usually mild to moderate in nature, injection site nodules, and injection site reactions. Asked to call the clinic or send me a mychart message if they experience any issues, such as fatigue, nausea, headache, rash, or dizziness. Advised that they can take ibuprofen  or tylenol  for injection site pain if needed.   Alan Geralds, PharmD, CPP, BCIDP, AAHIVP Clinical Pharmacist Practitioner Infectious Diseases Clinical Pharmacist [facility]

## 2024-02-27 NOTE — Progress Notes (Signed)
 [address], [place], [place], [address]                                                                  Phn. [phone]; Fax: [phone]                                                                             Date: 02/27/24  Reason for Visit: Routine HIV care.    HPI: Alyssa Wilson is a 53 y.o.old female with a history of Obesity, H pylori infection s/p treatment,  HIV who is here for regular fu. Patient previously followed by Dr elaine, last visit with Dr Overton in 08/31/23. Well controlled on Biktarvy .   Interval hx/current visit: Taking Biktarvy , denies missed doses or concerns. She is however interested in cabenuva  injection. She reports following with PCP,  due for colonoscopy in 1-2 years, reports having pap smear last year and h/o partial hysterectomy. Thinks had pneumonia vaccine last year with PCP and will check on it. Married and sexually active with husband. Denies smoking, alcohol and recreational drugs. She works for [employer]. Follows dental clinic.   She has been closely following with Orthopedics Dr Vernetta post left shoulder arthroscopy with subacromial decompression.   No complaints today.   ROS: As stated in above HPI; all other systems were reviewed and are otherwise negative unless noted below  No reported fever / chills, night sweats, unintentional weight loss, acute visual change, odynophagia, chest pain/pressure, new or worsened SOB or WOB, nausea, vomiting, diarrhea, dysuria, GU discharge, syncope, seizures, red/hot swollen joints, hallucinations / delusions, rashes, new allergies, unusual / excessive bleeding, swollen lymph nodes, or new hospitalizations/ED visits/Urgent Care visits since the pt was last seen.  PMH/ PSH/ FamHx / Social Hx , medications  and allergies reviewed and updated as appropriate; please see corresponding tab in EHR / prior notes                                        Current Outpatient Medications on File Prior to Visit  Medication Sig Dispense Refill   Ascorbic Acid (VITAMIN C) 500 MG CHEW Chew by mouth daily.     Cholecalciferol (VITAMIN D3 PO) Take by mouth daily.     rosuvastatin  (CRESTOR ) 5 MG tablet Take 5 mg by mouth daily.     WEGOVY 2.4 MG/0.75ML SOAJ      No current facility-administered medications on file prior to visit.   No Known Allergies  Past Medical History:  Diagnosis Date   Anemia  Headache(784.0)    HIV (human immunodeficiency virus infection) (HCC)    Morbid obesity (HCC)    Past Surgical History:  Procedure Laterality Date   ABDOMINAL HYSTERECTOMY     CERVICAL CONIZATION W/BX     LAPAROSCOPY ABDOMEN DIAGNOSTIC     ectopic pregnant  1998   Social History   Socioeconomic History   Marital status: Married    Spouse name: Not on file   Number of children: Not on file   Years of education: Not on file   Highest education level: Not on file  Occupational History   Not on file  Tobacco Use   Smoking status: Never   Smokeless tobacco: Never  Vaping Use   Vaping status: Never Used  Substance and Sexual Activity   Alcohol use: No    Alcohol/week: 0.5 standard drinks of alcohol    Types: 1 drink(s) per week   Drug use: No   Sexual activity: Yes    Comment: declined condoms  Other Topics Concern   Not on file  Social History Narrative   Left handed      College    Social Drivers of Health   Financial Resource Strain: Not on file  Food Insecurity: Not on file  Transportation Needs: Not on file  Physical Activity: Not on file  Stress: Not on file  Social Connections: Unknown (12/21/2021)   [facility]   Social Network    Social Network: Not on file  Intimate Partner Violence: Unknown (11/12/2021)   Received from [facility]   HITS    Physically  Hurt: Not on file    Insult or Talk Down To: Not on file    Threaten Physical Harm: Not on file    Scream or Curse: Not on file   Family History  Problem Relation Age of Onset   Heart failure Mother    Stroke Mother    Diabetes Mother    Lupus Mother    Fibromyalgia Mother    Lupus Brother    Vitals  BP 119/80   Pulse 74   Temp 97.6 F (36.4 C) (Temporal)   Ht 5' 2 (1.575 m)   Wt 167 lb (75.8 kg)   LMP 03/17/2012   SpO2 100%   BMI 30.54 kg/m   Examination  Gen: no acute distress HEENT: [place]/AT, no scleral icterus, no pale conjunctivae, hearing normal, oral mucosa moist Neck: Supple Cardio: Regular rate and rhythm, s1s2 Resp: Pulmonary effort normal in room air, normal breath sounds  GI: nondistended GU: Musc: Extremities: No pedal edema Skin: No rashes Neuro: grossly non focal , awake, alert and oriented * 3  Psych: Calm, cooperative  Lab Results HIV 1 RNA Quant  Date Value  02/13/2024 NOT DETECTED copies/mL  08/22/2023 30 Copies/mL (H)  08/05/2022 <20 Copies/mL (H)   CD4 T Cell Abs (/uL)  Date Value  02/13/2024 785  12/29/2020 850  10/09/2019 1,211   No results found for: HIV1GENOSEQ Lab Results  Component Value Date   WBC 5.1 08/22/2023   HGB 12.9 08/22/2023   HCT 39.0 08/22/2023   MCV 82.3 08/22/2023   PLT 148 08/22/2023    Lab Results  Component Value Date   CREATININE 0.94 08/22/2023   BUN 16 08/22/2023   NA 140 08/22/2023   K 4.3 08/22/2023   CL 105 08/22/2023   CO2 27 08/22/2023   Lab Results  Component Value Date   ALT 96 (H) 08/22/2023   AST 65 (H) 08/22/2023  ALKPHOS 82 11/07/2016   BILITOT 0.5 08/22/2023    Lab Results  Component Value Date   CHOL 226 (H) 08/22/2023   TRIG 49 08/22/2023   HDL 75 08/22/2023   LDLCALC 136 (H) 08/22/2023   No results found for: HAV Lab Results  Component Value Date   HEPBSAG NEG 02/05/2008   HEPBSAB NEG 02/05/2008   Lab Results  Component Value Date   HCVAB NEG 02/05/2008   Lab  Results  Component Value Date   CHLAMYDIAWP Negative 08/05/2022   N Negative 08/05/2022   No results found for: GCPROBEAPT No results found for: QUANTGOLD    Health Maintenance: Immunization History  Administered Date(s) Administered   Hepatitis B 07/28/2009, 02/02/2010, 06/24/2010   Influenza Split 09/19/2011, 06/06/2012   Influenza Whole 07/28/2009, 06/24/2010   Influenza,inj,Quad PF,6+ Mos 07/23/2013, 05/15/2014, 09/02/2015, 07/11/2017, 10/08/2018, 04/10/2019, 04/14/2020, 04/30/2021   Influenza-Unspecified 04/21/2020, 05/04/2023   Meningococcal Mcv4o 11/21/2016, 10/08/2018   PFIZER(Purple Top)SARS-COV-2 Vaccination 11/09/2019, 12/04/2019, 06/15/2020, 11/21/2020   PPD Test 07/30/2009   Pfizer(Comirnaty)Fall Seasonal Vaccine 12 years and older 04/27/2023   Pneumococcal Conjugate-13 03/05/2018   Pneumococcal Polysaccharide-23 01/25/2008, 02/05/2008, 07/23/2013   Tdap 08/08/1998, 03/02/2009, 04/30/2021   Assessment/Plan: # HIV - continue Biktarvy  - labs today - she is intersted in cabenuva  injections and seems to eligible. Will have pharmacy talk to her - fu pending cabenuva  plans     # Anxiety/Depression - stable, no SI/HI  # Dyslipidemia  - on rosuvastatin    - fu with PCP  # STD Screening  - urine GC and RPR  # Immunization  - deferred   #Health maintenance - Lipid panel  - Follows with dental clinic.  - She is aware about colon ca, breast ca and cervical ca screening and will fu with PCP for those  Patient's labs were reviewed as well as his previous records. Patients questions were addressed and answered. Safe sex counseling done.  I spent 35 minutes involved in face-to-face and non-face-to-face activities for this patient on the day of the visit. Professional time spent includes the following activities: Preparing to see the patient (review of tests), Obtaining and reviewing separately obtained history (prior notes from Dr Overton), Performing a medically  appropriate examination and evaluation , Ordering medications/labs,  communicating with other health care professionals pharmacy, Documenting clinical information in the EMR, Independently interpreting results (not separately reported), Communicating results to the patient, Counseling and educating the patient and Care coordination (not separately reported).   Of note, portions of this note may have been created with voice recognition software. While this note has been edited for accuracy, occasional wrong-word or 'sound-a-like' substitutions may have occurred due to the inherent limitations of voice recognition software.   Electronically signed by:  Annalee Orem, MD Infectious Disease Physician [facility] [address] [place], [place] [address] Phone: [phone]  Fax: [phone]

## 2024-02-27 NOTE — Telephone Encounter (Signed)
 Pharmacy Patient Advocate Encounter  Insurance verification completed.   The patient is insured through [facility]   Ran test claim for Cabenvua. Currently a quantity of is a 30 day supply and prescription will have to be filled [facility] .   This test claim was processed through [facility]- copay amounts may vary at other pharmacies due to pharmacy/plan contracts, or as the patient moves through the different stages of their insurance plan.

## 2024-02-27 NOTE — Addendum Note (Signed)
 Addended by: WADDELL ALAN PARAS on: 02/27/2024 10:14 AM   Modules accepted: Orders

## 2024-02-28 LAB — URINE CYTOLOGY ANCILLARY ONLY
Chlamydia: NEGATIVE
Comment: NEGATIVE
Comment: NORMAL
Neisseria Gonorrhea: NEGATIVE

## 2024-02-28 LAB — T-HELPER CELLS (CD4) COUNT (NOT AT ARMC)
CD4 % Helper T Cell: 47 % (ref 33–65)
CD4 T Cell Abs: 807 /uL (ref 400–1790)

## 2024-03-02 ENCOUNTER — Ambulatory Visit: Payer: Self-pay | Admitting: Infectious Diseases

## 2024-03-02 LAB — COMPREHENSIVE METABOLIC PANEL WITH GFR
AG Ratio: 1.5 (calc) (ref 1.0–2.5)
ALT: 91 U/L — ABNORMAL HIGH (ref 6–29)
AST: 50 U/L — ABNORMAL HIGH (ref 10–35)
Albumin: 4.6 g/dL (ref 3.6–5.1)
Alkaline phosphatase (APISO): 105 U/L (ref 37–153)
BUN: 14 mg/dL (ref 7–25)
CO2: 28 mmol/L (ref 20–32)
Calcium: 9.2 mg/dL (ref 8.6–10.4)
Chloride: 106 mmol/L (ref 98–110)
Creat: 0.88 mg/dL (ref 0.50–1.03)
Globulin: 3 g/dL (ref 1.9–3.7)
Glucose, Bld: 86 mg/dL (ref 65–99)
Potassium: 4.3 mmol/L (ref 3.5–5.3)
Sodium: 140 mmol/L (ref 135–146)
Total Bilirubin: 0.5 mg/dL (ref 0.2–1.2)
Total Protein: 7.6 g/dL (ref 6.1–8.1)
eGFR: 78 mL/min/1.73m2 (ref >=60)

## 2024-03-02 LAB — LIPID PANEL
Cholesterol: 190 mg/dL (ref <200)
HDL: 74 mg/dL (ref >=50)
LDL Cholesterol (Calc): 100 mg/dL — ABNORMAL HIGH
Non-HDL Cholesterol (Calc): 116 mg/dL (ref <130)
Total CHOL/HDL Ratio: 2.6 (calc) (ref <5.0)
Triglycerides: 75 mg/dL (ref <150)

## 2024-03-02 LAB — HIV RNA, RTPCR W/R GT (RTI, PI,INT)
HIV 1 RNA Quant: NOT DETECTED {copies}/mL
HIV-1 RNA Quant, Log: NOT DETECTED {Log_copies}/mL

## 2024-03-02 LAB — RPR: RPR Ser Ql: NONREACTIVE

## 2024-03-04 ENCOUNTER — Other Ambulatory Visit (HOSPITAL_COMMUNITY): Payer: Self-pay

## 2024-03-05 ENCOUNTER — Telehealth: Payer: Self-pay

## 2024-03-05 ENCOUNTER — Other Ambulatory Visit (HOSPITAL_COMMUNITY): Payer: Self-pay

## 2024-03-05 NOTE — Telephone Encounter (Addendum)
 Pharmacy Patient Advocate Encounter- Cabenuva  BIV-Medical Benefit:  J code: [id number]  CPT code: 03627  Dx Code: B20  NO PA is required through [employer]. Authorization# [id number]  Patient have met [phone] Deductible as of 01/20/24 and will not have a office copay.  Patient is Enrolled in [employer] Portal

## 2024-03-05 NOTE — Telephone Encounter (Signed)
 Submitted a Prior Authorization request to [facility] for Cabenuva  via CoverMyMeds. Will update once we receive a response.  J Code: CPT:  PA ID: [id number]

## 2024-03-08 NOTE — Progress Notes (Signed)
 NEW REFERRAL TO CPP CLINIC    HPI: Alyssa Wilson is a 53 y.o. female who presents to the [facility] pharmacy clinic for Cabenuva  administration.  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 pylori infection 09/02/2015   Dyslipidemia 07/22/2013   Obesity 07/22/2013   ANXIETY 06/24/2010   ALLERGIC RHINITIS 02/02/2010   HUMAN PAPILLOMAVIRUS 02/05/2008   Depression 02/05/2008   TUBAL PREGNANCY 02/05/2008   PAP SMER CERV W/LW GRADE SQUAMOUS INTRAEPITH LES 02/05/2008   Human immunodeficiency virus (HIV) disease (HCC) 01/22/2008    Patient's Medications  New Prescriptions   No medications on file  Previous Medications   ASCORBIC ACID (VITAMIN C) 500 MG CHEW    Chew by mouth daily.   BICTEGRAVIR-EMTRICITABINE -TENOFOVIR  AF (BIKTARVY ) 50-200-25 MG TABS TABLET    TAKE 1 TABLET BY MOUTH 1 TIME A DAY.   CABOTEGRAVIR  & RILPIVIRINE  ER (CABENUVA ) 600 & 900 MG/3ML INJECTION    Inject 1 kit into the muscle every 30 (thirty) days.   CABOTEGRAVIR  & RILPIVIRINE  ER (CABENUVA ) 600 & 900 MG/3ML INJECTION    Inject 1 kit into the muscle every 2 (two) months.   CHOLECALCIFEROL (VITAMIN D3 PO)    Take by mouth daily.   ROSUVASTATIN  (CRESTOR ) 5 MG TABLET    Take 5 mg by mouth daily.   WEGOVY 2.4 MG/0.75ML SOAJ      Modified Medications   No medications on file  Discontinued Medications   No medications on file    Allergies: No Known Allergies  Past Medical History: Past Medical History:  Diagnosis Date   Anemia    Headache(784.0)    HIV (human immunodeficiency virus infection) (HCC)     Morbid obesity (HCC)     Social History: Social History   Socioeconomic History   Marital status: Married    Spouse name: Not on file   Number of children: Not on file   Years of education: Not on file   Highest education level: Not on file  Occupational History   Not on file  Tobacco Use   Smoking status: Never   Smokeless tobacco: Never  Vaping Use   Vaping status: Never Used  Substance and Sexual Activity   Alcohol use: No    Alcohol/week: 0.5 standard drinks of alcohol    Types: 1 drink(s) per week   Drug use: No   Sexual activity: Yes    Comment: declined condoms  Other Topics Concern   Not on file  Social History Narrative   Left handed      College    Social Drivers of Health   Financial Resource Strain: Not on file  Food Insecurity: Not on file  Transportation Needs: Not on file  Physical Activity: Not on file  Stress: Not on file  Social Connections: Unknown (12/21/2021)   Received from [facility]   Social Network    Social Network: Not on file    Labs: Lab Results  Component Value Date   HIV1RNAQUANT NOT DETECTED 02/27/2024   HIV1RNAQUANT NOT DETECTED 02/13/2024   HIV1RNAQUANT 30 (H) 08/22/2023   CD4TABS 807 02/27/2024  CD4TABS 785 02/13/2024   CD4TABS 850 12/29/2020    RPR and STI Lab Results  Component Value Date   LABRPR NON-REACTIVE 02/27/2024   LABRPR NON-REACTIVE 08/22/2023   LABRPR NON-REACTIVE 08/05/2022   LABRPR NON-REACTIVE 12/29/2020   LABRPR NON-REACTIVE 10/09/2019    STI Results GC CT  02/27/2024  8:52 AM Negative  Negative   08/05/2022  9:08 AM Negative  Negative   12/29/2020  9:46 AM Negative  Negative   10/09/2019  4:02 PM Negative  Negative   11/12/2014 12:00 AM Negative  Negative   04/07/2012 12:55 PM  NEGATIVE     Hepatitis B Lab Results  Component Value Date   HEPBSAB NEG 02/05/2008   HEPBSAG NEG 02/05/2008   Hepatitis C No results found for: HEPCAB, HCVRNAPCRQN Hepatitis A No results found  for: HAV Lipids: Lab Results  Component Value Date   CHOL 190 02/27/2024   TRIG 75 02/27/2024   HDL 74 02/27/2024   CHOLHDL 2.6 02/27/2024   VLDL 13 11/07/2016   LDLCALC 100 (H) 02/27/2024    Current HIV Regimen: Biktarvy   TARGET DATE: The 5th of the month  Assessment: Alyssa Wilson presents today for their first initiation injection for Cabenuva . Counseled that Cabenuva  is two separate intramuscular injections in the gluteal muscle on each side for each visit. Explained that the second injection is 30 days after the initial injection then every 2 months thereafter. Discussed the need for viral load monitoring every 2 months for the first 6 months and then periodically afterwards as their provider sees the need. Discussed the rare but significant chance of developing resistance despite compliance. Explained that showing up to injection appointments is very important and warned that if 2 appointments are missed, it will be reassessed by their provider whether they are a good candidate for injection therapy. Counseled on possible side effects associated with the injections such as injection site pain, which is usually mild to moderate in nature, injection site nodules, and injection site reactions. Asked to call the clinic or send me a mychart message if they experience any issues, such as fatigue, nausea, headache, rash, or dizziness. Advised that they can take ibuprofen  or tylenol  for injection site pain if needed.   Administered cabotegravir  600mg /53mL in left upper outer quadrant of the gluteal muscle. Administered rilpivirine  900 mg/3mL in the right upper outer quadrant of the gluteal muscle. Monitored patient for 10 minutes after injection. Injections were tolerated well without issue. Counseled to stop taking Biktarvy  after today's dose and to call with any issues that may arise. Will make follow up appointments for second initiation injection in 30 days and then maintenance injections every 2  months thereafter for 6 months.   Given increased liver enzymes and potential risk for Cabenuva  to worsen this, will closely monitor these every two months after her initial loading doses. States she is establishing with a GI specialist soon.   Plan: - Stop Biktarvy  - First Cabenuva  injections administered - Second initiation injection scheduled for 8/29 with me  - Maintenance injections scheduled for 10/29 and 12/30 with me  - Call with any issues or questions  Alan Geralds, PharmD, CPP, BCIDP, AAHIVP Clinical Pharmacist Practitioner Infectious Diseases Clinical Pharmacist [facility]

## 2024-03-11 ENCOUNTER — Telehealth: Payer: Self-pay | Admitting: Pharmacist

## 2024-03-11 NOTE — Telephone Encounter (Signed)
 Patient is approved for Cabenuva  through medical benefits. Reached out to [place] and requested appeal through pharmacy benefits due to copay management. Submitted and faxed appeal to insurance today. Will await response.  Alan Geralds, PharmD, CPP, BCIDP, AAHIVP Clinical Pharmacist Practitioner Infectious Diseases Clinical Pharmacist [facility] for [facility] Disease

## 2024-03-12 ENCOUNTER — Ambulatory Visit (INDEPENDENT_AMBULATORY_CARE_PROVIDER_SITE_OTHER): Admitting: Pharmacist

## 2024-03-12 ENCOUNTER — Other Ambulatory Visit (HOSPITAL_COMMUNITY): Payer: Self-pay

## 2024-03-12 ENCOUNTER — Other Ambulatory Visit: Payer: Self-pay

## 2024-03-12 DIAGNOSIS — B20 Human immunodeficiency virus [HIV] disease: Secondary | ICD-10-CM | POA: Diagnosis not present

## 2024-03-12 MED ORDER — CABOTEGRAVIR & RILPIVIRINE ER 600 & 900 MG/3ML IM SUER
1.0000 | Freq: Once | INTRAMUSCULAR | Status: AC
  Administered 2024-03-12: 1 via INTRAMUSCULAR

## 2024-03-19 ENCOUNTER — Other Ambulatory Visit (HOSPITAL_COMMUNITY): Payer: Self-pay

## 2024-03-19 ENCOUNTER — Telehealth: Payer: Self-pay

## 2024-03-19 NOTE — Telephone Encounter (Signed)
 Pharmacy Patient Advocate Encounter- Cabenuva  BIV-Pharmacy Benefit:  PA was submitted to [employer] and has been approved through: 03/19/24-03/19/25 Authorization# [id number]  Please send prescription to Specialty Pharmacy[facility]: [phone] Estimated Copay is: 0.00

## 2024-04-02 ENCOUNTER — Telehealth: Payer: Self-pay

## 2024-04-02 NOTE — Telephone Encounter (Signed)
 [facility] Patient Advocate Encounter  Patient's medications CABEUVA have been couriered to [facility] and will be administered at the patients appointment on 04/05/24.  Charmaine Sharps, CPhT Specialty Pharmacy Patient [facility] for Infectious Disease Phone: [phone] Fax:  [phone]

## 2024-04-05 ENCOUNTER — Other Ambulatory Visit: Payer: Self-pay

## 2024-04-05 ENCOUNTER — Ambulatory Visit (INDEPENDENT_AMBULATORY_CARE_PROVIDER_SITE_OTHER): Admitting: Pharmacist

## 2024-04-05 DIAGNOSIS — B20 Human immunodeficiency virus [HIV] disease: Secondary | ICD-10-CM | POA: Diagnosis not present

## 2024-04-05 DIAGNOSIS — Z113 Encounter for screening for infections with a predominantly sexual mode of transmission: Secondary | ICD-10-CM

## 2024-04-05 MED ORDER — CABOTEGRAVIR & RILPIVIRINE ER 600 & 900 MG/3ML IM SUER
1.0000 | Freq: Once | INTRAMUSCULAR | Status: AC
  Administered 2024-04-05: 1 via INTRAMUSCULAR

## 2024-04-05 NOTE — Progress Notes (Signed)
 HPI: Alyssa Wilson is a 53 y.o. female who presents to the [facility] pharmacy clinic for Cabenuva  administration.  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 pylori infection 09/02/2015   Dyslipidemia 07/22/2013   Obesity 07/22/2013   ANXIETY 06/24/2010   ALLERGIC RHINITIS 02/02/2010   HUMAN PAPILLOMAVIRUS 02/05/2008   Depression 02/05/2008   TUBAL PREGNANCY 02/05/2008   PAP SMER CERV W/LW GRADE SQUAMOUS INTRAEPITH LES 02/05/2008   Human immunodeficiency virus (HIV) disease (HCC) 01/22/2008    Patient's Medications  New Prescriptions   No medications on file  Previous Medications   ASCORBIC ACID (VITAMIN C) 500 MG CHEW    Chew by mouth daily.   CABOTEGRAVIR  & RILPIVIRINE  ER (CABENUVA ) 600 & 900 MG/3ML INJECTION    Inject 1 kit into the muscle every 30 (thirty) days.   CABOTEGRAVIR  & RILPIVIRINE  ER (CABENUVA ) 600 & 900 MG/3ML INJECTION    Inject 1 kit into the muscle every 2 (two) months.   CHOLECALCIFEROL (VITAMIN D3 PO)    Take by mouth daily.   ROSUVASTATIN  (CRESTOR ) 5 MG TABLET    Take 5 mg by mouth daily.   WEGOVY 2.4 MG/0.75ML SOAJ      Modified Medications   No medications on file  Discontinued Medications   No medications on file    Allergies: No Known Allergies  Labs: Lab Results  Component Value Date   HIV1RNAQUANT NOT DETECTED 02/27/2024   HIV1RNAQUANT NOT DETECTED 02/13/2024   HIV1RNAQUANT 30 (H) 08/22/2023   CD4TABS 807 02/27/2024   CD4TABS 785 02/13/2024   CD4TABS 850 12/29/2020    RPR and STI Lab Results  Component Value Date   LABRPR  NON-REACTIVE 02/27/2024   LABRPR NON-REACTIVE 08/22/2023   LABRPR NON-REACTIVE 08/05/2022   LABRPR NON-REACTIVE 12/29/2020   LABRPR NON-REACTIVE 10/09/2019    STI Results GC CT  02/27/2024  8:52 AM Negative  Negative   08/05/2022  9:08 AM Negative  Negative   12/29/2020  9:46 AM Negative  Negative   10/09/2019  4:02 PM Negative  Negative   11/12/2014 12:00 AM Negative  Negative   04/07/2012 12:55 PM  NEGATIVE     Hepatitis B Lab Results  Component Value Date   HEPBSAB NEG 02/05/2008   HEPBSAG NEG 02/05/2008   Hepatitis C No results found for: HEPCAB, HCVRNAPCRQN Hepatitis A No results found for: HAV Lipids: Lab Results  Component Value Date   CHOL 190 02/27/2024   TRIG 75 02/27/2024   HDL 74 02/27/2024   CHOLHDL 2.6 02/27/2024   VLDL 13 11/07/2016   LDLCALC 100 (H) 02/27/2024    TARGET DATE: 5  Assessment: Alyssa Wilson presents today for her maintenance Cabenuva  injections. Past injections were tolerated well without issues. Last HIV RNA was negative in 02/27/24. Doing well with no issues today.  Administered cabotegravir  600mg /29mL in left upper outer quadrant of the gluteal muscle. Administered rilpivirine  900 mg/3mL in the right upper outer quadrant of the gluteal muscle. No issues with injections. Alyssa Wilson will follow up in 2 months for next set of injections.  Plan: - HIV RNA - Cabenuva  injections  administered - Next injections scheduled for 06/05/24 - Call with any issues or questions  Elma Fail, PharmD PGY1 Clinical Pharmacist [facility]  04/05/2024 7:53 AM

## 2024-04-07 LAB — HIV-1 RNA QUANT-NO REFLEX-BLD
HIV 1 RNA Quant: NOT DETECTED {copies}/mL
HIV-1 RNA Quant, Log: NOT DETECTED {Log_copies}/mL

## 2024-04-15 ENCOUNTER — Other Ambulatory Visit (HOSPITAL_COMMUNITY): Payer: Self-pay

## 2024-05-02 ENCOUNTER — Telehealth: Payer: Self-pay

## 2024-05-02 NOTE — Telephone Encounter (Signed)
 Patient called related to billing via patient assistance for Cabanuva. Staff spoke with pharmacy team and this billing issues is in the process of being resolved. Patient updated and advised if issues persists send a copy via mychart so we can assist further if needed.   Patient appreciative of staff assistance.  Alyssa Wilson

## 2024-05-23 ENCOUNTER — Telehealth: Payer: Self-pay

## 2024-05-23 NOTE — Telephone Encounter (Signed)
 [facility] Patient Advocate Encounter  Patient's medications Cabenuva  have been couriered to [facility] from [facility] and will be administered at the patients appointment on 06/05/24.  Arland Hutchinson, CPhT Specialty Pharmacy Patient [facility] for Infectious Disease Phone: [phone] Fax:  [phone]

## 2024-06-03 NOTE — Progress Notes (Unsigned)
 HPI: Alyssa Wilson is a 53 y.o. female who presents to the [facility] pharmacy clinic for Cabenuva  administration.  Referring ID Physician: Dr. Dea  Patient Active Problem List   Diagnosis Date Noted   HIV disease (HCC) 02/27/2024   Screening for STDs (sexually transmitted diseases) 02/27/2024   Health care maintenance 02/27/2024   Anxiety and depression 02/27/2024   Complex tear of medial meniscus of right knee as current injury 06/02/2021   CRI (chronic renal insufficiency), stage 1 10/21/2019   Pigmented skin lesion of uncertain nature 03/08/2016   GERD (gastroesophageal reflux disease) 09/02/2015   H. pylori infection 09/02/2015   Dyslipidemia 07/22/2013   Obesity 07/22/2013   ANXIETY 06/24/2010   ALLERGIC RHINITIS 02/02/2010   HUMAN PAPILLOMAVIRUS 02/05/2008   Depression 02/05/2008   TUBAL PREGNANCY 02/05/2008   PAP SMER CERV W/LW GRADE SQUAMOUS INTRAEPITH LES 02/05/2008   Human immunodeficiency virus (HIV) disease (HCC) 01/22/2008    Patient's Medications  New Prescriptions   No medications on file  Previous Medications   ASCORBIC ACID (VITAMIN C) 500 MG CHEW    Chew by mouth daily.   CABOTEGRAVIR  & RILPIVIRINE  ER (CABENUVA ) 600 & 900 MG/3ML INJECTION    Inject 1 kit into the muscle every 30 (thirty) days.   CABOTEGRAVIR  & RILPIVIRINE  ER (CABENUVA ) 600 & 900 MG/3ML INJECTION    Inject 1 kit into the muscle every 2 (two) months.   CHOLECALCIFEROL (VITAMIN D3 PO)    Take by mouth daily.   ROSUVASTATIN  (CRESTOR ) 5 MG TABLET    Take 5 mg by mouth daily.   WEGOVY 2.4 MG/0.75ML SOAJ      Modified Medications   No medications on file  Discontinued Medications   No medications on file    Allergies: No Known Allergies  Past Medical History: Past Medical History:  Diagnosis Date   Anemia    Headache(784.0)    HIV (human immunodeficiency virus infection) (HCC)    Morbid obesity (HCC)     Social History: Social History   Socioeconomic History   Marital status:  Married    Spouse name: Not on file   Number of children: Not on file   Years of education: Not on file   Highest education level: Not on file  Occupational History   Not on file  Tobacco Use   Smoking status: Never   Smokeless tobacco: Never  Vaping Use   Vaping status: Never Used  Substance and Sexual Activity   Alcohol use: No    Alcohol/week: 0.5 standard drinks of alcohol    Types: 1 drink(s) per week   Drug use: No   Sexual activity: Yes    Comment: declined condoms  Other Topics Concern   Not on file  Social History Narrative   Left handed      College    Social Drivers of Health   Financial Resource Strain: Not on file  Food Insecurity: Not on file  Transportation Needs: Not on file  Physical Activity: Not on file  Stress: Not on file  Social Connections: Unknown (12/21/2021)   Received from [facility]   Social Network    Social Network: Not on file    Labs: Lab Results  Component Value Date   HIV1RNAQUANT NOT DETECTED 04/05/2024   HIV1RNAQUANT NOT DETECTED 02/27/2024   HIV1RNAQUANT NOT DETECTED 02/13/2024   CD4TABS 807 02/27/2024   CD4TABS 785 02/13/2024   CD4TABS 850 12/29/2020    RPR and STI Lab Results  Component Value Date  LABRPR NON-REACTIVE 02/27/2024   LABRPR NON-REACTIVE 08/22/2023   LABRPR NON-REACTIVE 08/05/2022   LABRPR NON-REACTIVE 12/29/2020   LABRPR NON-REACTIVE 10/09/2019    STI Results GC CT  02/27/2024  8:52 AM Negative  Negative   08/05/2022  9:08 AM Negative  Negative   12/29/2020  9:46 AM Negative  Negative   10/09/2019  4:02 PM Negative  Negative   11/12/2014 12:00 AM Negative  Negative   04/07/2012 12:55 PM  NEGATIVE     Hepatitis B Lab Results  Component Value Date   HEPBSAB NEG 02/05/2008   HEPBSAG NEG 02/05/2008   Hepatitis C No results found for: HEPCAB, HCVRNAPCRQN Hepatitis A No results found for: HAV Lipids: Lab Results  Component Value Date   CHOL 190 02/27/2024   TRIG 75 02/27/2024    HDL 74 02/27/2024   CHOLHDL 2.6 02/27/2024   VLDL 13 11/07/2016   LDLCALC 100 (H) 02/27/2024    TARGET DATE:  The 5th of the month  Assessment: Alyssa Wilson presents today for their maintenance Cabenuva  injections. Initial/past injections were tolerated well without issues. No problems with systemic effects of injections. Patient did experience painful knot on left side for 2 weeks.   Administered cabotegravir  600mg /78mL in left upper outer quadrant of the gluteal muscle. Administered rilpivirine  900 mg/3mL in the right upper outer quadrant of the gluteal muscle. Monitored patient for 10 minutes after injection. Injections were tolerated well without issue. Patient will follow up in 2 months for next injection. Will check HIV RNA today along with CMP for LFT monitoring.  Eligible for Menveo and PCV20 vaccines which she defers today. Already received flu and COVID vaccines [facility] this [facility]. Will update HBV immunity check today as well. Provided documentation from PCP with negative HAV antibody in November 2024.  Plan: - Administer Cabenuva  injections  - Check HIV RNA, CMP, and HBV sAb   - Next injections scheduled for 12/30 with Dr. Dea and 3/3 with me  - Call with any issues or questions  Alan Geralds, PharmD, CPP, BCIDP, AAHIVP Clinical Pharmacist Practitioner Infectious Diseases Clinical Pharmacist [facility]

## 2024-06-05 ENCOUNTER — Other Ambulatory Visit: Payer: Self-pay

## 2024-06-05 ENCOUNTER — Ambulatory Visit (INDEPENDENT_AMBULATORY_CARE_PROVIDER_SITE_OTHER): Payer: Self-pay | Admitting: Pharmacist

## 2024-06-05 DIAGNOSIS — B20 Human immunodeficiency virus [HIV] disease: Secondary | ICD-10-CM | POA: Diagnosis not present

## 2024-06-05 MED ORDER — CABOTEGRAVIR & RILPIVIRINE ER 600 & 900 MG/3ML IM SUER
1.0000 | Freq: Once | INTRAMUSCULAR | Status: AC
  Administered 2024-06-05: 1 via INTRAMUSCULAR

## 2024-06-07 LAB — COMPREHENSIVE METABOLIC PANEL WITH GFR
AG Ratio: 1.4 (calc) (ref 1.0–2.5)
ALT: 28 U/L (ref 6–29)
AST: 20 U/L (ref 10–35)
Albumin: 4.5 g/dL (ref 3.6–5.1)
Alkaline phosphatase (APISO): 95 U/L (ref 37–153)
BUN: 15 mg/dL (ref 7–25)
CO2: 27 mmol/L (ref 20–32)
Calcium: 9.5 mg/dL (ref 8.6–10.4)
Chloride: 105 mmol/L (ref 98–110)
Creat: 0.96 mg/dL (ref 0.50–1.03)
Globulin: 3.2 g/dL (ref 1.9–3.7)
Glucose, Bld: 77 mg/dL (ref 65–99)
Potassium: 4.2 mmol/L (ref 3.5–5.3)
Sodium: 139 mmol/L (ref 135–146)
Total Bilirubin: 0.5 mg/dL (ref 0.2–1.2)
Total Protein: 7.7 g/dL (ref 6.1–8.1)
eGFR: 70 mL/min/1.73m2 (ref >=60)

## 2024-06-07 LAB — HIV-1 RNA QUANT-NO REFLEX-BLD
HIV 1 RNA Quant: NOT DETECTED {copies}/mL
HIV-1 RNA Quant, Log: NOT DETECTED {Log_copies}/mL

## 2024-06-07 LAB — HEPATITIS B SURFACE ANTIBODY,QUALITATIVE: Hep B S Ab: NONREACTIVE

## 2024-06-10 ENCOUNTER — Encounter: Payer: Self-pay | Admitting: Radiology

## 2024-07-26 ENCOUNTER — Telehealth: Payer: Self-pay

## 2024-07-26 NOTE — Telephone Encounter (Signed)
 [facility] Patient Advocate Encounter  Patient's medications CABENUVA  have been couriered to [facility] from [facility] and will be administered at the patients appointment on 08/06/24.  Charmaine Sharps, CPhT Specialty Pharmacy Patient [facility] for Infectious Disease Phone: [phone] Fax:  [phone]

## 2024-08-06 ENCOUNTER — Encounter: Payer: Self-pay | Admitting: Infectious Diseases

## 2024-08-06 ENCOUNTER — Other Ambulatory Visit: Payer: Self-pay

## 2024-08-06 ENCOUNTER — Ambulatory Visit (INDEPENDENT_AMBULATORY_CARE_PROVIDER_SITE_OTHER): Admitting: Infectious Diseases

## 2024-08-06 ENCOUNTER — Encounter: Payer: Self-pay | Admitting: Pharmacist

## 2024-08-06 VITALS — BP 122/85 | HR 76 | Temp 97.1°F | Ht 62.25 in | Wt 168.0 lb

## 2024-08-06 DIAGNOSIS — E785 Hyperlipidemia, unspecified: Secondary | ICD-10-CM

## 2024-08-06 DIAGNOSIS — Z113 Encounter for screening for infections with a predominantly sexual mode of transmission: Secondary | ICD-10-CM | POA: Diagnosis not present

## 2024-08-06 DIAGNOSIS — B20 Human immunodeficiency virus [HIV] disease: Secondary | ICD-10-CM | POA: Diagnosis not present

## 2024-08-06 DIAGNOSIS — Z79899 Other long term (current) drug therapy: Secondary | ICD-10-CM | POA: Insufficient documentation

## 2024-08-06 DIAGNOSIS — Z7185 Encounter for immunization safety counseling: Secondary | ICD-10-CM | POA: Insufficient documentation

## 2024-08-06 DIAGNOSIS — Z23 Encounter for immunization: Secondary | ICD-10-CM | POA: Diagnosis not present

## 2024-08-06 DIAGNOSIS — F418 Other specified anxiety disorders: Secondary | ICD-10-CM | POA: Diagnosis not present

## 2024-08-06 LAB — T-HELPER CELLS (CD4) COUNT (NOT AT ARMC)
CD4 % Helper T Cell: 44 % (ref 33–65)
CD4 T Cell Abs: 733 /uL (ref 400–1790)

## 2024-08-06 MED ORDER — CABOTEGRAVIR & RILPIVIRINE ER 600 & 900 MG/3ML IM SUER
1.0000 | Freq: Once | INTRAMUSCULAR | Status: AC
  Administered 2024-08-06: 1 via INTRAMUSCULAR

## 2024-08-06 NOTE — Progress Notes (Signed)
 "                                                                                                                                                                                                      [address], [place], [place], [address]                                                                  Phn. [phone]; Fax: [phone]                                                                             Date: 08/06/24  Reason for Visit: Cabenuva  injection   HPI: Alyssa Wilson is a 53 y.o.old female with a history of Obesity, H pylori infection s/p treatment,  HIV who is here for regular fu. Patient previously followed by Dr elaine.  12/30 She has been switched to Cabenuva  injections since 8/5 and no concerns with Injection.  Denies STD screening.  Willing to get Prevnar 20 but wants to do hep B vaccine with her PCP.  She is going to a trip in January.  No other concerns.   ROS: As stated in above HPI; all other systems were reviewed and are otherwise negative unless noted below  No reported fever / chills, night sweats, unintentional weight loss, acute visual change, odynophagia, chest pain/pressure, new or worsened SOB or WOB, nausea, vomiting, diarrhea, dysuria, GU discharge, syncope, seizures, red/hot swollen joints, hallucinations / delusions, rashes, new allergies, unusual / excessive bleeding, swollen lymph nodes, or new hospitalizations/ED visits/Urgent Care visits since the pt was last seen.  PMH/ PSH/ FamHx / Social Hx , medications and allergies reviewed and updated as appropriate; please see corresponding tab in EHR / prior notes                                        Current Outpatient Medications on File Prior to Visit  Medication Sig Dispense Refill  Ascorbic Acid (VITAMIN C) 500 MG CHEW Chew by mouth daily.     cabotegravir  & rilpivirine  ER (CABENUVA ) 600 & 900 MG/3ML injection Inject 1 kit into the muscle every 30 (thirty) days. 6 mL 1   cabotegravir  &  rilpivirine  ER (CABENUVA ) 600 & 900 MG/3ML injection Inject 1 kit into the muscle every 2 (two) months. 6 mL 5   Cholecalciferol (VITAMIN D3 PO) Take by mouth daily.     rosuvastatin  (CRESTOR ) 5 MG tablet Take 5 mg by mouth daily.     WEGOVY 2.4 MG/0.75ML SOAJ      No current facility-administered medications on file prior to visit.   No Known Allergies  Past Medical History:  Diagnosis Date   Anemia    Headache(784.0)    HIV (human immunodeficiency virus infection) (HCC)    Morbid obesity (HCC)    Past Surgical History:  Procedure Laterality Date   ABDOMINAL HYSTERECTOMY     CERVICAL CONIZATION W/BX     LAPAROSCOPY ABDOMEN DIAGNOSTIC     ectopic pregnant  1998   Social History   Socioeconomic History   Marital status: Married    Spouse name: Not on file   Number of children: Not on file   Years of education: Not on file   Highest education level: Not on file  Occupational History   Not on file  Tobacco Use   Smoking status: Never   Smokeless tobacco: Never  Vaping Use   Vaping status: Never Used  Substance and Sexual Activity   Alcohol use: No    Alcohol/week: 0.5 standard drinks of alcohol    Types: 1 drink(s) per week   Drug use: No   Sexual activity: Yes    Comment: declined condoms  Other Topics Concern   Not on file  Social History Narrative   Left handed      College    Social Drivers of Health   Tobacco Use: Low Risk (05/16/2024)   [facility]   Patient History    Passive Exposure: Not on file    Smokeless Tobacco Use: Never    Smoking Tobacco Use: Never  Financial Resource Strain: Not on file  Food Insecurity: Not on file  Transportation Needs: Not on file  Physical Activity: Not on file  Stress: Not on file  Social Connections: Not on file  Intimate Partner Violence: Not on file  Depression (PHQ2-9): Low Risk (02/27/2024)   Depression (PHQ2-9)    PHQ-2 Score: 1  Alcohol Screen: Not on file  Housing: Not on file   Utilities: Not on file  Health Literacy: Not on file   Family History  Problem Relation Age of Onset   Heart failure Mother    Stroke Mother    Diabetes Mother    Lupus Mother    Fibromyalgia Mother    Lupus Brother    Vitals  BP 122/85   Pulse 76   Temp (!) 97.1 F (36.2 C) (Temporal)   Ht 5' 2.25 (1.581 m)   Wt 168 lb (76.2 kg)   LMP 03/17/2012   SpO2 100%   BMI 30.48 kg/m '  Examination  Gen: no acute distress HEENT: [place]/AT, no scleral icterus, no pale conjunctivae, hearing normal, oral mucosa moist Neck: Supple Cardio: Normal HR Resp: Pulmonary effort normal in room air GI: nondistended GU: Musc: Extremities: No pedal edema Skin: No rashes Neuro: awake, alert Psych: Calm, cooperative  Lab Results HIV 1 RNA Quant (copies/mL)  Date Value  06/05/2024 NOT DETECTED  04/05/2024 NOT DETECTED  02/27/2024 NOT DETECTED   CD4 T Cell Abs (/uL)  Date Value  02/27/2024 807  02/13/2024 785  12/29/2020 850   No results found for: HIV1GENOSEQ Lab Results  Component Value Date   WBC 5.1 08/22/2023   HGB 12.9 08/22/2023   HCT 39.0 08/22/2023   MCV 82.3 08/22/2023   PLT 148 08/22/2023    Lab Results  Component Value Date   CREATININE 0.96 06/05/2024   BUN 15 06/05/2024   NA 139 06/05/2024   K 4.2 06/05/2024   CL 105 06/05/2024   CO2 27 06/05/2024   Lab Results  Component Value Date   ALT 28 06/05/2024   AST 20 06/05/2024   ALKPHOS 82 11/07/2016   BILITOT 0.5 06/05/2024    Lab Results  Component Value Date   CHOL 190 02/27/2024   TRIG 75 02/27/2024   HDL 74 02/27/2024   LDLCALC 100 (H) 02/27/2024   No results found for: HAV Lab Results  Component Value Date   HEPBSAG NEG 02/05/2008   HEPBSAB NON-REACTIVE 06/05/2024   Lab Results  Component Value Date   HCVAB NEG 02/05/2008   Lab Results  Component Value Date   CHLAMYDIAWP Negative 02/27/2024   N Negative 02/27/2024   No results found for: GCPROBEAPT No results found for:  QUANTGOLD    Health Maintenance: Immunization History  Administered Date(s) Administered   Fluzone Influenza virus vaccine,trivalent (IIV3), split virus 04/27/2023   Hepatitis B 07/28/2009, 02/02/2010, 06/24/2010   Influenza Inj Mdck Quad Pf 05/18/2022   Influenza Split 09/19/2011, 06/06/2012   Influenza Whole 07/28/2009, 06/24/2010   Influenza,inj,Quad PF,6+ Mos 07/23/2013, 05/15/2014, 09/02/2015, 07/11/2017, 10/08/2018, 04/10/2019, 04/14/2020, 04/30/2021   Influenza,trivalent, recombinat, inj, PF 05/16/2024   Influenza-Unspecified 04/21/2020, 05/04/2023   Meningococcal Mcv4o 11/21/2016, 10/08/2018   Moderna Sars-Covid-2 Vaccination 05/18/2022   PFIZER(Purple Top)SARS-COV-2 Vaccination 11/09/2019, 12/04/2019, 06/15/2020, 11/21/2020   PPD Test 07/30/2009   Pfizer Covid-19 Vaccine Bivalent Booster 31yrs & up 05/10/2021   Pfizer(Comirnaty)Fall Seasonal Vaccine 12 years and older 04/27/2023, 05/16/2024   Pneumococcal Conjugate-13 03/05/2018   Pneumococcal Polysaccharide-23 01/25/2008, 02/05/2008, 07/23/2013   Tdap 08/08/1998, 03/02/2009, 04/30/2021   Zoster, Live 11/25/2021, 05/10/2022   Assessment/Plan: # HIV - cabenuva  injection today  - labs today  - follow q2 months cabenuva  schedule. Fu with me in 5-6 months   # Anxiety/Depression - stable  # Dyslipidemia  - on rosuvastatin    - fu with PCP  # STD Screening  - low risk  - deferred   # Immunization  - Received flu and covid vaccine in pharmacy 05/16/24 - PCV 20, she wants to do HBV series with her PCP  #Health maintenance - Follows with dental clinic.  - Have discussed about colon ca, breast ca and cervical ca screening and will fu with PCP for those  I personally spent a total of 30 minutes in the care of the patient today including preparing to see the patient, getting/reviewing separately obtained history, performing a medically appropriate exam/evaluation, counseling and educating, placing orders, documenting  clinical information in the EHR, independently interpreting results, and communicating results.   Electronically signed by:  Annalee Orem, MD Infectious Disease Physician [facility] for Infectious Disease [address] [place], [place] [address] Phone: [phone]  Fax: [phone]  "

## 2024-08-09 ENCOUNTER — Ambulatory Visit: Payer: Self-pay | Admitting: Infectious Diseases

## 2024-08-09 LAB — COMPREHENSIVE METABOLIC PANEL WITH GFR
AG Ratio: 1.4 (calc) (ref 1.0–2.5)
ALT: 40 U/L — ABNORMAL HIGH (ref 6–29)
AST: 20 U/L (ref 10–35)
Albumin: 4.4 g/dL (ref 3.6–5.1)
Alkaline phosphatase (APISO): 120 U/L (ref 37–153)
BUN: 12 mg/dL (ref 7–25)
CO2: 29 mmol/L (ref 20–32)
Calcium: 9.6 mg/dL (ref 8.6–10.4)
Chloride: 105 mmol/L (ref 98–110)
Creat: 0.93 mg/dL (ref 0.50–1.03)
Globulin: 3.1 g/dL (ref 1.9–3.7)
Glucose, Bld: 82 mg/dL (ref 65–99)
Potassium: 4 mmol/L (ref 3.5–5.3)
Sodium: 141 mmol/L (ref 135–146)
Total Bilirubin: 0.5 mg/dL (ref 0.2–1.2)
Total Protein: 7.5 g/dL (ref 6.1–8.1)
eGFR: 73 mL/min/1.73m2

## 2024-08-09 LAB — HIV RNA, RTPCR W/R GT (RTI, PI,INT)
HIV 1 RNA Quant: <20 {copies}/mL — AB
HIV-1 RNA Quant, Log: <1.3 {Log_copies}/mL — AB

## 2024-08-15 ENCOUNTER — Other Ambulatory Visit

## 2024-08-29 ENCOUNTER — Ambulatory Visit: Payer: Self-pay | Admitting: Infectious Diseases

## 2024-10-08 ENCOUNTER — Encounter: Payer: Self-pay | Admitting: Pharmacist
